# Patient Record
Sex: Female | Born: 1965 | Race: White | Hispanic: No | Marital: Married | Smoking: Former smoker
Health system: Southern US, Community
[De-identification: ages and names within clinical notes are randomized; demographics above are authoritative.]

## PROBLEM LIST (undated history)

## (undated) DIAGNOSIS — R112 Nausea with vomiting, unspecified: Secondary | ICD-10-CM

## (undated) DIAGNOSIS — E538 Deficiency of other specified B group vitamins: Secondary | ICD-10-CM

## (undated) DIAGNOSIS — Z9889 Other specified postprocedural states: Secondary | ICD-10-CM

## (undated) DIAGNOSIS — N83202 Unspecified ovarian cyst, left side: Secondary | ICD-10-CM

## (undated) DIAGNOSIS — K219 Gastro-esophageal reflux disease without esophagitis: Secondary | ICD-10-CM

## (undated) DIAGNOSIS — M549 Dorsalgia, unspecified: Secondary | ICD-10-CM

## (undated) DIAGNOSIS — G8929 Other chronic pain: Secondary | ICD-10-CM

## (undated) DIAGNOSIS — F32A Depression, unspecified: Secondary | ICD-10-CM

## (undated) DIAGNOSIS — M1990 Unspecified osteoarthritis, unspecified site: Secondary | ICD-10-CM

## (undated) DIAGNOSIS — F419 Anxiety disorder, unspecified: Secondary | ICD-10-CM

## (undated) DIAGNOSIS — F329 Major depressive disorder, single episode, unspecified: Secondary | ICD-10-CM

## (undated) DIAGNOSIS — R1031 Right lower quadrant pain: Secondary | ICD-10-CM

## (undated) DIAGNOSIS — N83201 Unspecified ovarian cyst, right side: Secondary | ICD-10-CM

## (undated) HISTORY — PX: BARTHOLIN GLAND CYST EXCISION: SHX565

## (undated) HISTORY — PX: CERVICAL FUSION: SHX112

## (undated) HISTORY — PX: LEFT OOPHORECTOMY: SHX1961

## (undated) HISTORY — PX: ABDOMINAL HYSTERECTOMY: SHX81

## (undated) HISTORY — PX: AUGMENTATION MAMMAPLASTY: SUR837

## (undated) HISTORY — PX: BACK SURGERY: SHX140

## (undated) HISTORY — PX: HERNIA REPAIR: SHX51

## (undated) HISTORY — PX: HIATAL HERNIA REPAIR: SHX195

---

## 1999-02-02 ENCOUNTER — Other Ambulatory Visit: Admission: RE | Admit: 1999-02-02 | Discharge: 1999-02-02 | Payer: Self-pay | Admitting: Obstetrics

## 1999-02-02 ENCOUNTER — Encounter: Admission: RE | Admit: 1999-02-02 | Discharge: 1999-02-02 | Payer: Self-pay | Admitting: Obstetrics & Gynecology

## 1999-02-19 ENCOUNTER — Encounter: Admission: RE | Admit: 1999-02-19 | Discharge: 1999-02-19 | Payer: Self-pay | Admitting: Obstetrics & Gynecology

## 1999-12-04 ENCOUNTER — Inpatient Hospital Stay (HOSPITAL_COMMUNITY): Admission: AD | Admit: 1999-12-04 | Discharge: 1999-12-04 | Payer: Self-pay | Admitting: Obstetrics

## 1999-12-07 ENCOUNTER — Emergency Department (HOSPITAL_COMMUNITY): Admission: EM | Admit: 1999-12-07 | Discharge: 1999-12-07 | Payer: Self-pay | Admitting: Emergency Medicine

## 2000-02-10 ENCOUNTER — Encounter: Admission: RE | Admit: 2000-02-10 | Discharge: 2000-02-10 | Payer: Self-pay | Admitting: Obstetrics

## 2000-12-31 ENCOUNTER — Emergency Department (HOSPITAL_COMMUNITY): Admission: EM | Admit: 2000-12-31 | Discharge: 2000-12-31 | Payer: Self-pay | Admitting: *Deleted

## 2001-05-08 ENCOUNTER — Other Ambulatory Visit: Admission: RE | Admit: 2001-05-08 | Discharge: 2001-05-08 | Payer: Self-pay | Admitting: *Deleted

## 2001-05-08 ENCOUNTER — Encounter (INDEPENDENT_AMBULATORY_CARE_PROVIDER_SITE_OTHER): Payer: Self-pay | Admitting: *Deleted

## 2001-05-08 ENCOUNTER — Encounter: Admission: RE | Admit: 2001-05-08 | Discharge: 2001-05-08 | Payer: Self-pay | Admitting: Obstetrics & Gynecology

## 2001-06-15 ENCOUNTER — Encounter: Payer: Self-pay | Admitting: Emergency Medicine

## 2001-06-15 ENCOUNTER — Emergency Department (HOSPITAL_COMMUNITY): Admission: EM | Admit: 2001-06-15 | Discharge: 2001-06-15 | Payer: Self-pay | Admitting: *Deleted

## 2002-08-26 ENCOUNTER — Other Ambulatory Visit: Admission: RE | Admit: 2002-08-26 | Discharge: 2002-08-26 | Payer: Self-pay | Admitting: Obstetrics and Gynecology

## 2003-02-13 ENCOUNTER — Other Ambulatory Visit: Admission: RE | Admit: 2003-02-13 | Discharge: 2003-02-13 | Payer: Self-pay | Admitting: Obstetrics and Gynecology

## 2003-08-28 ENCOUNTER — Other Ambulatory Visit: Admission: RE | Admit: 2003-08-28 | Discharge: 2003-08-28 | Payer: Self-pay | Admitting: Obstetrics and Gynecology

## 2004-11-01 ENCOUNTER — Other Ambulatory Visit: Admission: RE | Admit: 2004-11-01 | Discharge: 2004-11-01 | Payer: Self-pay | Admitting: Obstetrics and Gynecology

## 2005-04-25 ENCOUNTER — Other Ambulatory Visit: Admission: RE | Admit: 2005-04-25 | Discharge: 2005-04-25 | Payer: Self-pay | Admitting: Obstetrics and Gynecology

## 2005-05-21 ENCOUNTER — Inpatient Hospital Stay (HOSPITAL_COMMUNITY): Admission: AD | Admit: 2005-05-21 | Discharge: 2005-05-21 | Payer: Self-pay | Admitting: Obstetrics and Gynecology

## 2005-06-01 ENCOUNTER — Encounter: Admission: RE | Admit: 2005-06-01 | Discharge: 2005-06-01 | Payer: Self-pay | Admitting: Plastic Surgery

## 2005-08-18 ENCOUNTER — Encounter (INDEPENDENT_AMBULATORY_CARE_PROVIDER_SITE_OTHER): Payer: Self-pay | Admitting: Specialist

## 2005-08-18 ENCOUNTER — Observation Stay (HOSPITAL_COMMUNITY): Admission: RE | Admit: 2005-08-18 | Discharge: 2005-08-19 | Payer: Self-pay | Admitting: Obstetrics and Gynecology

## 2006-05-10 ENCOUNTER — Encounter: Admission: RE | Admit: 2006-05-10 | Discharge: 2006-05-10 | Payer: Self-pay | Admitting: Obstetrics and Gynecology

## 2006-05-14 ENCOUNTER — Inpatient Hospital Stay (HOSPITAL_COMMUNITY): Admission: AD | Admit: 2006-05-14 | Discharge: 2006-05-14 | Payer: Self-pay | Admitting: Obstetrics & Gynecology

## 2006-06-01 ENCOUNTER — Ambulatory Visit (HOSPITAL_COMMUNITY): Admission: RE | Admit: 2006-06-01 | Discharge: 2006-06-01 | Payer: Self-pay | Admitting: Obstetrics and Gynecology

## 2006-06-01 ENCOUNTER — Encounter (INDEPENDENT_AMBULATORY_CARE_PROVIDER_SITE_OTHER): Payer: Self-pay | Admitting: Specialist

## 2006-07-20 ENCOUNTER — Inpatient Hospital Stay (HOSPITAL_COMMUNITY): Admission: AD | Admit: 2006-07-20 | Discharge: 2006-07-21 | Payer: Self-pay | Admitting: Obstetrics and Gynecology

## 2006-08-18 ENCOUNTER — Encounter (INDEPENDENT_AMBULATORY_CARE_PROVIDER_SITE_OTHER): Payer: Self-pay | Admitting: Specialist

## 2006-08-18 ENCOUNTER — Ambulatory Visit (HOSPITAL_COMMUNITY): Admission: RE | Admit: 2006-08-18 | Discharge: 2006-08-18 | Payer: Self-pay | Admitting: Obstetrics and Gynecology

## 2006-10-08 ENCOUNTER — Emergency Department (HOSPITAL_COMMUNITY): Admission: EM | Admit: 2006-10-08 | Discharge: 2006-10-08 | Payer: Self-pay | Admitting: Emergency Medicine

## 2008-09-30 ENCOUNTER — Encounter: Admission: RE | Admit: 2008-09-30 | Discharge: 2008-09-30 | Payer: Self-pay | Admitting: Chiropractic Medicine

## 2008-10-10 ENCOUNTER — Ambulatory Visit (HOSPITAL_COMMUNITY): Admission: RE | Admit: 2008-10-10 | Discharge: 2008-10-10 | Payer: Self-pay | Admitting: Chiropractic Medicine

## 2009-06-03 ENCOUNTER — Emergency Department (HOSPITAL_COMMUNITY): Admission: EM | Admit: 2009-06-03 | Discharge: 2009-06-04 | Payer: Self-pay | Admitting: Emergency Medicine

## 2009-09-09 ENCOUNTER — Emergency Department: Payer: Self-pay | Admitting: Emergency Medicine

## 2010-01-20 ENCOUNTER — Encounter: Admission: RE | Admit: 2010-01-20 | Discharge: 2010-01-20 | Payer: Self-pay | Admitting: Neurosurgery

## 2010-02-12 ENCOUNTER — Ambulatory Visit (HOSPITAL_COMMUNITY): Admission: RE | Admit: 2010-02-12 | Discharge: 2010-02-12 | Payer: Self-pay | Admitting: Neurosurgery

## 2010-04-08 ENCOUNTER — Encounter: Admission: RE | Admit: 2010-04-08 | Discharge: 2010-04-08 | Payer: Self-pay | Admitting: Obstetrics and Gynecology

## 2010-04-15 ENCOUNTER — Encounter: Admission: RE | Admit: 2010-04-15 | Discharge: 2010-04-15 | Payer: Self-pay | Admitting: Neurosurgery

## 2010-08-29 ENCOUNTER — Encounter: Payer: Self-pay | Admitting: Neurosurgery

## 2010-10-24 LAB — DIFFERENTIAL
Basophils Relative: 0 % (ref 0–1)
Eosinophils Relative: 1 % (ref 0–5)
Lymphocytes Relative: 26 % (ref 12–46)
Monocytes Absolute: 0.4 10*3/uL (ref 0.1–1.0)
Monocytes Relative: 4 % (ref 3–12)
Neutro Abs: 5.6 10*3/uL (ref 1.7–7.7)

## 2010-10-24 LAB — CBC
HCT: 41.3 % (ref 36.0–46.0)
Hemoglobin: 14.3 g/dL (ref 12.0–15.0)
MCHC: 34.7 g/dL (ref 30.0–36.0)
MCV: 98 fL (ref 78.0–100.0)

## 2010-10-24 LAB — TYPE AND SCREEN
ABO/RH(D): A POS
Antibody Screen: NEGATIVE

## 2010-10-24 LAB — ABO/RH: ABO/RH(D): A POS

## 2010-11-11 LAB — CBC
HCT: 41.2 % (ref 36.0–46.0)
Hemoglobin: 14 g/dL (ref 12.0–15.0)
MCHC: 34 g/dL (ref 30.0–36.0)
MCV: 97.4 fL (ref 78.0–100.0)
RBC: 4.23 MIL/uL (ref 3.87–5.11)

## 2010-11-11 LAB — BASIC METABOLIC PANEL
CO2: 27 mEq/L (ref 19–32)
Chloride: 105 mEq/L (ref 96–112)
GFR calc Af Amer: >60 mL/min (ref >60)
Potassium: 4 mEq/L (ref 3.5–5.1)
Sodium: 137 mEq/L (ref 135–145)

## 2010-11-11 LAB — RAPID URINE DRUG SCREEN, HOSP PERFORMED
Barbiturates: NOT DETECTED
Opiates: NOT DETECTED

## 2010-11-11 LAB — DIFFERENTIAL
Basophils Relative: 0 % (ref 0–1)
Eosinophils Absolute: 0.1 10*3/uL (ref 0.0–0.7)
Eosinophils Relative: 1 % (ref 0–5)
Monocytes Absolute: 0.6 10*3/uL (ref 0.1–1.0)
Monocytes Relative: 7 % (ref 3–12)
Neutro Abs: 4.8 10*3/uL (ref 1.7–7.7)

## 2010-12-24 NOTE — Op Note (Signed)
NAMEKANIA, REGNIER                 ACCOUNT NO.:  [id number]   MEDICAL RECORD NO.:  [id number]          PATIENT TYPE:  AMB   LOCATION:  SDC                           [facility]ILITY:  [facility]   PHYSICIAN:  Duke Salvia. Marcelle Overlie, M.D.DATE OF BIRTH:  12/08/1965   DATE OF PROCEDURE:  06/01/2006  DATE OF DISCHARGE:                                 OPERATIVE REPORT   PREOPERATIVE DIAGNOSIS:  Inflamed left labia minora sebaceous cyst.   POSTOPERATIVE DIAGNOSIS:  Inflamed left labia minora sebaceous cyst.   PROCEDURE:  Excision of labial cyst.   ANESTHESIA:  Local plus general.   BLOOD LOSS:  Minimal.   SPECIMEN REMOVED:  Left labial cyst.   SURGEON:  Duke Salvia. Marcelle Overlie, M.D.   PROCEDURE AND FINDINGS:  The patient taken to the operating room.  After  adequate level of general anesthesia was obtained, the patient legs in  stirrups, perineum and vagina prepped and draped with Betadine.  The left  labia minora cyst was infiltrated with 0.5% Marcaine plain, was grasped with  an Allis clamp, scalpel used to excise an ellipse, a Bovie used for  hemostasis at the base, then closure obtained with interrupted 3-0 Vicryl  Rapide sutures with excellent hemostasis.  She received Toradol IV in the  operating room and went to recovery room in good condition.      Richard M. Marcelle Overlie, M.D.  Electronically Signed     RMH/MEDQ  D:  06/01/2006  T:  06/02/2006  Job:  [id number]

## 2010-12-24 NOTE — H&P (Signed)
NAMECAROLAN, [name]                 ACCOUNT NO.:  [id number]   MEDICAL RECORD NO.:  [id number]          PATIENT TYPE:  AMB   LOCATION:  SDC                           [facility]   PHYSICIAN:  Duke Salvia. Marcelle Overlie, M.D.DATE OF BIRTH:  1966-04-11   DATE OF ADMISSION:  DATE OF DISCHARGE:                              HISTORY & PHYSICAL   CHIEF COMPLAINT:  Recurrent left ovarian cyst, chronic pelvic pain.   HISTORY OF PRESENT ILLNESS:  A 45 year old G2, P2, prior tubal.  This  patient underwent 1 year ago LAVH.  At that time her ovaries were  unremarkable and both were conserved.  That was performed secondary to  pelvic pain and possible adenomyosis.  The pathology report from her  hysterectomy showed no endometriosis or other abnormalities with an  unremarkable cervix.  She has done reasonably well since her  hysterectomy but has developed recurrent problems with pelvic and left  lower quadrant pain and we have been following a complex left ovarian  cyst.  Also this year she has had excision of a chronic left labial  cyst.  Most recent ultrasound at [facility] showed a complex  cyst in the left ovary, may represent a hemorrhagic follicle that was  approximately 2.3 x 2.3 x 2.9 cm with a moderate amount of fluid noted.  Due to the chronicity of this problem, she would prefer to have  laparoscopy with LSO.  This procedure including risks of bleeding,  infection, adjacent organ injury, the possible need for open or  additional surgery all discussed with her, which she understands and  accepts.   PAST MEDICAL HISTORY:  Prior surgery:  She has had abdominoplasty,  breast augmentation, LEEP, tubal, LAVH.   OBSTETRICAL HISTORY:  Two vaginal deliveries at term.   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   FAMILY HISTORY:  Significant for a sister with asthma.  Mother and  father both with hypertension and her mother is an  insulin-dependent  diabetic.   PHYSICAL EXAMINATION:  VITAL SIGNS:  Temperature 98.2, blood pressure  120/78.  HEENT:  Unremarkable.  NECK:  Supple without masses.  LUNGS:  Clear.  CARDIOVASCULAR:  Regular rate and rhythm without murmurs, rubs, or  gallops noted.  BREASTS:  Without masses.  She has had breast augmentation.  ABDOMEN:  Soft, flat, nontender.  Well-healed abdominoplasty.  PELVIC:  Vulva, vagina, vaginal cuff unremarkable.  Bimanual revealed  some tenderness in the left lower quadrant.  No definite mass noted.  EXTREMITIES AND NEUROLOGIC:  Unremarkable.   IMPRESSION:  Chronic left lower quadrant pain, recurrence of left  ovarian cyst.   PLAN:  Diagnostic laparoscopy with LSO.  Procedure and risks reviewed as  above.      Richard M. Marcelle Overlie, M.D.  Electronically Signed     RMH/MEDQ  D:  08/15/2006  T:  08/15/2006  Job:  [id number]

## 2010-12-24 NOTE — Op Note (Signed)
[name], [name]                 ACCOUNT NO.:  [id number]   MEDICAL RECORD NO.:  [id number]          PATIENT TYPE:  OIB   LOCATION:  9308                          [facility]ILITY:  [facility]   PHYSICIAN:  Duke Salvia. Marcelle Overlie, M.D.DATE OF BIRTH:  1965-10-13   DATE OF PROCEDURE:  08/28/2006  DATE OF DISCHARGE:  08/18/2006                               OPERATIVE REPORT   PREOPERATIVE DIAGNOSIS:  Acute and chronic pelvic pain, mainly left  lower quadrant and recurrent ovarian cyst.   POSTOPERATIVE DIAGNOSIS:  Acute and chronic pelvic pain, mainly left  lower quadrant and recurrent ovarian cyst, plus torsion of the left  tube.   PROCEDURE:  Diagnostic laparoscopy with lysis of adhesions and left  salpingo-oophorectomy.   SPECIMENS REMOVED:  Left tube and ovary.   SURGEON:  Duke Salvia. Marcelle Overlie, M.D.   ANESTHESIA:  General endotracheal.   COMPLICATIONS:  None.   DRAINS:  Foley catheter.   BLOOD LOSS:  Minimal.   PROCEDURE AND FINDINGS:  The patient taken to the operating room.  After  an adequate level of general anesthesia was attained, the patient's legs  in stirrups, the abdomen, perineum and vagina prepped and draped in  usual manner for laparoscopy.  The bladder was drained.  Attention  directed to the abdomen and the subumbilical area was infiltrated with  0.5% Marcaine plain.  A small incision was made and the Veress needle  was introduced without difficulty.  Its intra-abdominal position was  verified by pressure and water testing.  A 2.5 liter pneumoperitoneum  was then created.  Laparoscopic trocar and sleeve were then introduced  without difficulty.  Three fingerbreadths above the symphysis in the  midline a 5 mm trocar was inserted under direct visualization.  The  patient then placed in Trendelenburg.  The right ovary was completely  normal.  The uterus was surgically absent.  The omentum was adherent  into the area of the left ovary with some filmy adhesions.  Also there  was  distal torsion of the left fallopian tube which was kinked about  midsegment although the distal end was swollen and did still appeared  pink.  Left ovary was adherent to left pelvic side wall with filmy  adhesions.  The appendix and upper abdomen were otherwise unremarkable.   A third 5 mm trocar was inserted in the left lower quadrant after  negative transillumination.  With sharp scissors the adhesions were  lysed and the ovary was elevated off of the pelvic sidewall.  The course  of the ureter was noted to be below using the gyrus P K instrument,  the  left tube and ovary were coagulated and divided, cut into approximately  four pieces and removed through the scope.  The pelvis was then  irrigated with saline and aspirated.  The operative  site was noted to be hemostatic.  All ovarian fragments had been  removed.  Prior to closure sponge, needle and instrument counts were  reported as correct x2.  Gas allowed to escape.  Defect was closed with  4-0 Dexon subcuticular sutures and Dermabond.  She tolerated  this well  and went to recovery room in good condition.      Richard M. Marcelle Overlie, M.D.  Electronically Signed     RMH/MEDQ  D:  08/28/2006  T:  08/28/2006  Job:  [id number]

## 2010-12-24 NOTE — Op Note (Signed)
NAMEERIYAH, FERNANDO                 ACCOUNT NO.:  [id number]   MEDICAL RECORD NO.:  [id number]          PATIENT TYPE:  OBV   LOCATION:  9399                          [facility]  [facility]   PHYSICIAN:  Duke Salvia. Marcelle Overlie, M.D.DATE OF BIRTH:  10-16-1965   DATE OF PROCEDURE:  08/18/2005  DATE OF DISCHARGE:                                 OPERATIVE REPORT   PREOPERATIVE DIAGNOSES:  1.  History of persistent abnormal Papanicolaou smear.  2.  Chronic pelvic pain.   POSTOPERATIVE DIAGNOSIS:  Possible adenomyosis.   PROCEDURE:  Laparoscopically-assisted vaginal hysterectomy, lysis of  adhesions.   SURGEON:  Duke Salvia. Marcelle Overlie, M.D.   ASSISTANT:  Zelphia Cairo, M.D.   SPECIMENS REMOVED:  Uterus.   ESTIMATED BLOOD LOSS:  300 mL.   SPECIMENS REMOVED:  Uterus.   PROCEDURE AND FINDINGS:  The patient was taken to the operating room.  After  an adequate level of general endotracheal anesthesia was obtained with the  patient's legs in stirrups, the abdomen, perineum and vagina were prepped  and draped in the usual manner for a laparoscopy and the bladder was  drained.  EUA showed uterus to be the midposition, upper limit of normal  size.  Adnexa negative.  A Hulka tenaculum was positioned.  The subumbilical  area was infiltrated with 0.5% Marcaine plain.  A small incision was made  for an open technique.  The fascia and peritoneum were entered without  difficulty individually.  The open trocar sleeve was then positioned and  pneumoperitoneum was then created.  After a 2.5 L pneumoperitoneum was  observed, the laparoscope was inserted.  A 5 mm trocar was placed three  fingerbreadths above the symphysis in the midline under direct  visualization.  The video observation then showed that the anterior and  posterior cul-de-sac were unremarkable.  There was a solitary omental  adhesion to the right ovary which was lysed in an avascular plane.  The  right tube and ovary were normal.  The left ovary  had a corpus luteum cyst  of one pole but was otherwise unremarkable.  The upper abdomen was normal.  The Gyrus PK instrument was then positioned and the utero-ovarian pedicle  was coagulated and cut down to and including the round ligament.  The exact  same repeated on the opposite side with excellent hemostasis.  The vaginal  portion procedure was started at that point.  The cervix was grasped with a  tenaculum after a weighted speculum was positioned.  The cervicovaginal  mucosa was incised, posterior culdotomy performed without difficulty and the  bladder advanced superiorly.  The Gyrus PK instrument handheld was then used  to clamp, coagulate and cut the uterosacral ligaments.  The anterior  peritoneum could be identified, was entered, and a retractor used to gently  elevate the bladder out of the field.  In a sequential manner staying close  to the uterus, the cardinal ligament, uterine vasculature pedicle and upper  broad ligament pedicles were clamped, coagulated and cut.  The fundus of the  uterus was then delivered posteriorly.  The remaining pedicles were clamped,  coagulated and divided.  The cuff was then closed from 3 to 9 o'clock with a  running 2-0 Dexon locked suture.  A McCall culdoplasty suture was then  placed from left uterosacral ligament picking up posterior peritoneum across  to the opposite uterosacral ligament, which was then tied down.  Prior to  closure, sponge, needle and instrument counts were reported as correct x2.  The vaginal mucosa was then closed right-to-left with interrupted 2-0  Monocryl sutures.  Foley catheter positioned draining clear urine.   Repeat laparoscopy with Nezhat irrigation revealed excellent hemostasis of  the operative site even at reduced pressure.  Prior to closure, the sponge,  needle and instrument counts were reported as correct x2, instruments were  removed and the lower incision was glued with Dermabond.  The fascia on the   subumbilical incision was closed with a 2-0 Vicryl suture.  Vicryl 4-0  subcuticular on the skin with a pressure dressing applied.  She tolerated  this well, went to the recovery room in good condition.      Richard M. Marcelle Overlie, M.D.  Electronically Signed     RMH/MEDQ  D:  08/18/2005  T:  08/18/2005  Job:  [id number]

## 2010-12-24 NOTE — Discharge Summary (Signed)
NAMEBRYSTOL, [name]                 ACCOUNT NO.:  [id number]   MEDICAL RECORD NO.:  [id number]          PATIENT TYPE:  OBV   LOCATION:  9310                          [facility]   PHYSICIAN:  Duke Salvia. Marcelle Overlie, M.D.DATE OF BIRTH:  05/20/1966   DATE OF ADMISSION:  08/18/2005  DATE OF DISCHARGE:  08/19/2005                                 DISCHARGE SUMMARY   DISCHARGE DIAGNOSES:  1.  History of recurrent abnormal Pap smear.  2.  Pelvic pain.  3.  Possible adenomyosis.  4.  Laparoscopically assisted vaginal hysterectomy this admission.   HISTORY OF PRESENT ILLNESS:  For summary of admission history and physical  examination, please see the admission history and physical for details.  Briefly, a 45 year old G2, P2 who has had a prior tubal, LEEP in 1995 with  recurrent borderline Pap and chronic pelvic pain presents for definitive  hysterectomy.   HOSPITAL COURSE:  On August 18, 2005 under general anesthesia the patient  underwent laparoscopically assisted vaginal hysterectomy without  complications.  Small functional cyst was noted on the left but both ovaries  were conserved.  On the first postoperative day she was ready for discharge  home on regular diet, was afebrile, ambulatory and voiding without  difficulty.  Preoperative hemoglobin was 13.2, postoperative was on August 19, 2005 was 10.2.  The remainder of her blood work A positive blood type  antibody screen negative, UPT was negative.   DISPOSITION:  Patient is discharged on Tylox PRN pain, Phenergan tablets 25  mg one p.o. q.4-6h. PRN nausea.  She will return to the office in 7 to 10  days. She is advised to report any increased pain or bleeding, fever over  101.  She was given specific instructions regarding diet, sex and exercise.  Also advised to take stool softeners once or twice daily along with fiber  supplements or laxatives as needed.   CONDITION ON DISCHARGE:  Good.   ACTIVITY:  Graded increase.      Richard M. Marcelle Overlie, M.D.  Electronically Signed     RMH/MEDQ  D:  08/19/2005  T:  08/19/2005  Job:  [id number]

## 2010-12-24 NOTE — H&P (Signed)
NAMESAVONNA, [name]                 ACCOUNT NO.:  [id number]   MEDICAL RECORD NO.:  [id number]          PATIENT TYPE:  INP   LOCATION:                                [facility]   PHYSICIAN:  Duke Salvia. Marcelle Overlie, M.D.DATE OF BIRTH:  [birth year]   DATE OF ADMISSION:  08/18/2005  DATE OF DISCHARGE:                                HISTORY & PHYSICAL   CHIEF COMPLAINT:  Recurrent abnormal Pap smear, right ovarian cysts.   HISTORY OF PRESENT ILLNESS:  45 year old G2, P2, prior tubal.  This patient  had LEEP in 1995.  Since that time has had recurrent ASCUS or borderline  Paps.  The last including biopsy October 2006 showed no dysplasia, chronic  endocervicitis with Pap that was read out prior to that as ASCUS, high-risk  HPV not detected.   Due to some problems related to pelvic pain Dr. Odis [name] who had performed her  prior abdominoplasty ordered a CT with contrast June 01, 2005 that showed  a 2.4 cm left ovarian cyst with a dominant follicle on the right, no other  abnormalities.  This patient has had several concerns, not only the  recurrent ASCUS Paps, although the evaluation has been negative, but also  some cramping and chronic pelvic pain.  She has decided that she wants to  proceed with definitive hysterectomy.  This procedure of LAVH, possible USO  discussed in detail, the risks relative to bleeding, infection, adjacent  organ injury, the possible need for open or additional surgery, USO or BSO  along with the possible need for ERT all reviewed with her which she  understands and accepts.   PAST MEDICAL HISTORY:   ALLERGIES:  None.   OPERATIONS:  1.  Abdominoplasty.  2.  Tubal ligation.  3.  LEEP.   OBSTETRICAL HISTORY:  Two vaginal deliveries at term.   REVIEW OF SYSTEMS:  Significant for smoking one PPD and a history of HSV in  1989 without recurrence.   FAMILY HISTORY:  Significant for a sister with asthma.  Father and mother  both hypertension and her mother is  insulin-dependent diabetic.   PHYSICAL EXAMINATION:  VITAL SIGNS:  Temperature 98.2, blood pressure  120/70.  HEENT:  Unremarkable.  NECK:  Supple without masses.  LUNGS:  Clear.  CARDIOVASCULAR:  Regular rate and rhythm without murmurs, rubs, or gallops  noted.  BREASTS:  Implants.  ABDOMEN:  Soft, flat, nontender.  Her abdominoplasty incisions were well  healed.  Vulva, vagina, cervix were normal.  Uterus:  Mid position, normal  size, mobile.  Adnexa negative.  EXTREMITIES:  Unremarkable.  NEUROLOGIC:  Unremarkable.   IMPRESSION:  1.  Recurrent ASCUS Pap, history of prior LEEP.  2.  Pelvic pain.  3.  Ovarian cysts.   PLAN:  LAVH, possible USO.  Procedure and risks reviewed as above.      Richard M. Marcelle Overlie, M.D.  Electronically Signed     RMH/MEDQ  D:  08/15/2005  T:  08/15/2005  Job:  [id number]

## 2011-02-23 ENCOUNTER — Other Ambulatory Visit: Payer: Self-pay | Admitting: Family Medicine

## 2011-02-23 DIAGNOSIS — M545 Low back pain: Secondary | ICD-10-CM

## 2011-02-23 DIAGNOSIS — M542 Cervicalgia: Secondary | ICD-10-CM

## 2011-03-01 ENCOUNTER — Ambulatory Visit
Admission: RE | Admit: 2011-03-01 | Discharge: 2011-03-01 | Disposition: A | Discharge status: Home / Self Care | Payer: Managed Care, Other (non HMO) | Source: Ambulatory Visit | Attending: Family Medicine | Admitting: Family Medicine

## 2011-03-01 DIAGNOSIS — M545 Low back pain: Secondary | ICD-10-CM

## 2011-03-01 DIAGNOSIS — M542 Cervicalgia: Secondary | ICD-10-CM

## 2011-04-15 ENCOUNTER — Encounter (HOSPITAL_COMMUNITY): Payer: Self-pay | Admitting: *Deleted

## 2011-04-15 ENCOUNTER — Inpatient Hospital Stay (HOSPITAL_COMMUNITY)
Admission: AD | Admit: 2011-04-15 | Discharge: 2011-04-16 | Disposition: A | Discharge status: Home / Self Care | Payer: Managed Care, Other (non HMO) | Source: Ambulatory Visit | Attending: Obstetrics and Gynecology | Admitting: Obstetrics and Gynecology

## 2011-04-15 DIAGNOSIS — R1031 Right lower quadrant pain: Secondary | ICD-10-CM | POA: Insufficient documentation

## 2011-04-15 HISTORY — DX: Unspecified ovarian cyst, left side: N83.202

## 2011-04-15 HISTORY — DX: Unspecified ovarian cyst, right side: N83.201

## 2011-04-15 HISTORY — DX: Other chronic pain: G89.29

## 2011-04-15 HISTORY — DX: Dorsalgia, unspecified: M54.9

## 2011-04-15 NOTE — Progress Notes (Signed)
Pt did not feel well Thurs night and have fever. Forehead felt hot and had chills but did not take temp. Felt ok this am but about 1300 started having pain upper and lower abd. Constant dull pain with occ sharpness. Hx ovarian cysts L ovary removed.

## 2011-04-16 ENCOUNTER — Inpatient Hospital Stay (HOSPITAL_COMMUNITY): Payer: Managed Care, Other (non HMO)

## 2011-04-16 LAB — CBC
HCT: 40.2 % (ref 36.0–46.0)
Hemoglobin: 13.5 g/dL (ref 12.0–15.0)
MCHC: 33.6 g/dL (ref 30.0–36.0)
WBC: 7.8 10*3/uL (ref 4.0–10.5)

## 2011-04-16 LAB — URINALYSIS, ROUTINE W REFLEX MICROSCOPIC
Bilirubin Urine: NEGATIVE
Glucose, UA: NEGATIVE mg/dL
Specific Gravity, Urine: >1.03 — ABNORMAL HIGH (ref 1.005–1.030)
pH: 5.5 (ref 5.0–8.0)

## 2011-04-16 LAB — URINE MICROSCOPIC-ADD ON

## 2011-04-16 MED ORDER — KETOROLAC TROMETHAMINE 60 MG/2ML IM SOLN
60.0000 mg | Freq: Once | INTRAMUSCULAR | Status: AC
  Administered 2011-04-16: 60 mg via INTRAMUSCULAR
  Filled 2011-04-16: qty 2

## 2011-04-16 MED ORDER — OXYCODONE-ACETAMINOPHEN 5-325 MG PO TABS
2.0000 | ORAL_TABLET | Freq: Once | ORAL | Status: AC
  Administered 2011-04-16: 2 via ORAL
  Filled 2011-04-16: qty 2

## 2011-04-16 NOTE — ED Provider Notes (Signed)
Chief Complaint:  Abdominal Pain   [name] is  45 y.o. G2P0.  No LMP recorded. Patient has had a hysterectomy..  She presents complaining of Abdominal Pain . Onset is described as insidious and has been present for  Several  Hours. RLQ pain started this afternoon, worsened after going to bed. Describes pain as constant dull cramp with intermittent sharp shooting pain that radiates to RUQ and left side. Denies fever, chills, nausea, or vomiting.  Obstetrical/Gynecological History: OB History    Grav Para Term Preterm Abortions TAB SAB Ect Mult Living   2         2      Past Medical History: Past Medical History  Diagnosis Date  . Cysts of both ovaries   . Chronic back pain     had n/v and  fainting after procedure. N/v continues    Past Surgical History: Past Surgical History  Procedure Date  . Left oophorectomy     intestine twisted around tube and ovary   . Abdominal hysterectomy   . Bartholin gland cyst excision   . Cervical fusion     plates and screws in neck    Family History: No family history on file.  Social History: History  Substance Use Topics  . Smoking status: Current Everyday Smoker  . Smokeless tobacco: Never Used  . Alcohol Use: No    Allergies: No Known Allergies  Prescriptions prior to admission  Medication Sig Dispense Refill  . ALPRAZolam (XANAX) 1 MG tablet Take 1 mg by mouth 4 (four) times daily.        . cyclobenzaprine (FLEXERIL) 10 MG tablet Take 10 mg by mouth at bedtime as needed. For muscle spasm       . docusate sodium (COLACE) 100 MG capsule Take 100 mg by mouth 2 (two) times daily as needed. For constipation       . metoCLOPramide (REGLAN) 10 MG tablet Take 5-10 mg by mouth 2 (two) times daily as needed. For nausea       . oxyCODONE (ROXICODONE) 15 MG immediate release tablet Take 15 mg by mouth every 6 (six) hours as needed. For pain       . vitamin B-12 (CYANOCOBALAMIN) 1000 MCG tablet Take 2,000 mcg by mouth daily.           Review of Systems - Negative except what has been reviewed in HPI  Physical Exam   Blood pressure 135/95, pulse 96, temperature 98.5 F (36.9 C), resp. rate 22, height 5\' 3"  (1.6 m), weight 64.32 kg (141 lb 12.8 oz).  General: General appearance - alert, well appearing, and in no distress, normal appearing weight and uncomfortable Mental status - alert, oriented to person, place, and time, normal mood, behavior, speech, dress, motor activity, and thought processes, affect appropriate to mood Abdomen - tenderness noted diffuse, no guarding, no rebound, no pain with passive movement, neg psoas Focused Gynecological Exam: no palpable internal organs, right ovary/adnexa non palp, nontender to exam  Labs: Recent Results (from the past 24 hour(s))  URINALYSIS, ROUTINE W REFLEX MICROSCOPIC   Collection Time   04/16/11 12:20 AM      Component Value Range   Color, Urine YELLOW  YELLOW    Appearance CLEAR  CLEAR    Specific Gravity, Urine >1.030 (*) 1.005 - 1.030    pH 5.5  5.0 - 8.0    Glucose, UA NEGATIVE  NEGATIVE (mg/dL)   Hgb urine dipstick MODERATE (*) NEGATIVE  Bilirubin Urine NEGATIVE  NEGATIVE    Ketones, ur NEGATIVE  NEGATIVE (mg/dL)   Protein, ur NEGATIVE  NEGATIVE (mg/dL)   Urobilinogen, UA 0.2  0.0 - 1.0 (mg/dL)   Nitrite NEGATIVE  NEGATIVE    Leukocytes, UA NEGATIVE  NEGATIVE   URINE MICROSCOPIC-ADD ON   Collection Time   04/16/11 12:20 AM      Component Value Range   Squamous Epithelial / LPF FEW (*) RARE    WBC, UA 3-6  <3 (WBC/hpf)   RBC / HPF 3-6  <3 (RBC/hpf)   Bacteria, UA FEW (*) RARE   CBC   Collection Time   04/16/11  1:01 AM      Component Value Range   WBC 7.8  4.0 - 10.5 (K/uL)   RBC 4.17  3.87 - 5.11 (MIL/uL)   Hemoglobin 13.5  12.0 - 15.0 (g/dL)   HCT 16.1  09.6 - 04.5 (%)   MCV 96.4  78.0 - 100.0 (fL)   MCH 32.4  26.0 - 34.0 (pg)   MCHC 33.6  30.0 - 36.0 (g/dL)   RDW 40.9  81.1 - 91.4 (%)   Platelets 157  150 - 400 (K/uL)   Imaging Studies:   *RADIOLOGY REPORT*  Clinical Data: Right lower quadrant abdominal pain.  TRANSABDOMINAL AND TRANSVAGINAL ULTRASOUND OF PELVIS  Technique: Both transabdominal and transvaginal ultrasound  examinations of the pelvis were performed. Transabdominal technique  was performed for global imaging of the pelvis including uterus,  ovaries, adnexal regions, and pelvic cul-de-sac.  Comparison: Pelvic ultrasound performed 07/21/2006  It was necessary to proceed with endovaginal exam following the  transabdominal exam to visualize the right ovary.  Findings:  Uterus: Status post hysterectomy;no focal abnormalities seen.  Endometrium: N/A  Right ovary: Normal appearance/no adnexal mass; measures 3.4 x 2.0  x 2.1 cm. Color Doppler evaluation demonstrates grossly normal  color Doppler blood flow with respect to the right ovary.  Left ovary: Status post left-sided oophorectomy, per clinical  correlation.  Other findings: No free fluid seen within the pelvis.  IMPRESSION:  Status post hysterectomy and left-sided oophorectomy; right ovary  is unremarkable in appearance.  Original Report Authenticated By: Tonia Ghent, M.D.    Assessment: Abdominal Pain  Plan: Discharge Home Rx Percocet Follow up next with with Dr. Misty Stanley E. 04/16/2011,3:01 AM

## 2011-04-16 NOTE — Progress Notes (Signed)
Pt back from [facility]

## 2011-04-16 NOTE — ED Notes (Signed)
Pt up to [facility] to obtain u/a. States movement makes pain much worse. Reports normal BMS. Had epidural inj in back about 3 wks ago and has n/v since then. Takes Reglan which helps.

## 2011-04-16 NOTE — Progress Notes (Signed)
Written and verbal d/c instructions given and understanding voiced.  0310 S. [facility] CNM in to discuss u/s results and f/u with Dr Marcelle Overlie next wk.

## 2011-04-16 NOTE — Progress Notes (Signed)
[facility] CN[facility] in to see pt. Bimanual exam done and pt tol well.

## 2011-04-16 NOTE — Progress Notes (Signed)
Pt aware is for u/s and is 3rd in line. Heat Packs to pt's abdomen per request. Oked by S. [employer]

## 2011-04-16 NOTE — ED Notes (Signed)
0220 Pt to u/s via w/c

## 2013-04-13 ENCOUNTER — Emergency Department (HOSPITAL_COMMUNITY)
Admission: EM | Admit: 2013-04-13 | Discharge: 2013-04-13 | Disposition: A | Discharge status: Home / Self Care | Payer: Self-pay | Source: Home / Self Care

## 2013-04-13 ENCOUNTER — Encounter (HOSPITAL_COMMUNITY): Payer: Self-pay | Admitting: Emergency Medicine

## 2013-04-13 DIAGNOSIS — S39012S Strain of muscle, fascia and tendon of lower back, sequela: Secondary | ICD-10-CM

## 2013-04-13 DIAGNOSIS — IMO0002 Reserved for concepts with insufficient information to code with codable children: Secondary | ICD-10-CM

## 2013-04-13 LAB — POCT URINALYSIS DIP (DEVICE)
Glucose, UA: NEGATIVE mg/dL
Nitrite: NEGATIVE
Protein, ur: NEGATIVE mg/dL
Specific Gravity, Urine: <=1.005 (ref 1.005–1.030)
Urobilinogen, UA: 0.2 mg/dL (ref 0.0–1.0)

## 2013-04-13 LAB — POCT PREGNANCY, URINE: Preg Test, Ur: NEGATIVE

## 2013-04-13 MED ORDER — KETOROLAC TROMETHAMINE 30 MG/ML IJ SOLN
30.0000 mg | Freq: Once | INTRAMUSCULAR | Status: AC
  Administered 2013-04-13: 30 mg via INTRAMUSCULAR

## 2013-04-13 MED ORDER — DICLOFENAC POTASSIUM 50 MG PO TABS: 50.0000 mg | ORAL_TABLET | Freq: Three times a day (TID) | ORAL | Status: DC

## 2013-04-13 MED ORDER — HYDROCODONE-ACETAMINOPHEN 5-325 MG PO TABS: 1.0000 | ORAL_TABLET | ORAL | Status: DC | PRN

## 2013-04-13 MED ORDER — KETOROLAC TROMETHAMINE 30 MG/ML IJ SOLN
INTRAMUSCULAR | Status: AC
  Filled 2013-04-13: qty 1
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
## 2013-04-13 NOTE — ED Provider Notes (Signed)
CSN: [id number]     Arrival date & time 04/13/13  1639 History   First MD Initiated Contact with Patient 04/13/13 1700     Chief Complaint  Patient presents with  . Back Pain   (Consider location/radiation/quality/duration/timing/severity/associated sxs/prior Treatment) HPI Comments: 47 year old female states that she was lifting boxes yesterday he turned quickly and injured her right lower back. She is complaining of pain in the left paralumbar musculature that is worse with turning her body, attending to bend over, getting up from a chair in attempting to get into a chair. Denies focal paresthesias or weakness. She has a history of chronic intermittent low back pain and a history of herniated discs. She states she found her an old Vicodin of her husbands and some Ultram she is taking each of those and they did not help.   Past Medical History  Diagnosis Date  . Cysts of both ovaries   . Chronic back pain     had n/v and  fainting after procedure. N/v continues   Past Surgical History  Procedure Laterality Date  . Left oophorectomy      intestine twisted around tube and ovary   . Abdominal hysterectomy    . Bartholin gland cyst excision    . Cervical fusion      plates and screws in neck   No family history on file. History  Substance Use Topics  . Smoking status: Current Every Day Smoker  . Smokeless tobacco: Never Used  . Alcohol Use: No   OB History   Grav Para Term Preterm Abortions TAB SAB Ect Mult Living   2         2     Review of Systems  Constitutional: Positive for activity change. Negative for fever, chills and fatigue.  HENT: Negative.   Respiratory: Negative.   Cardiovascular: Negative.   Musculoskeletal:       As per HPI  Skin: Negative for color change, pallor and rash.  Neurological: Negative.     Allergies  Review of patient's allergies indicates no known allergies.  Home Medications   Current Outpatient Rx  Name  Route  Sig  Dispense  Refill   . ALPRAZolam (XANAX) 1 MG tablet   Oral   Take 1 mg by mouth 4 (four) times daily.           . cyclobenzaprine (FLEXERIL) 10 MG tablet   Oral   Take 10 mg by mouth at bedtime as needed. For muscle spasm          . diclofenac (CATAFLAM) 50 MG tablet   Oral   Take 1 tablet (50 mg total) by mouth 3 (three) times daily.   21 tablet   0   . docusate sodium (COLACE) 100 MG capsule   Oral   Take 100 mg by mouth 2 (two) times daily as needed. For constipation          . HYDROcodone-acetaminophen (NORCO/VICODIN) 5-325 MG per tablet   Oral   Take 1 tablet by mouth every 4 (four) hours as needed for pain.   15 tablet   0   . metoCLOPramide (REGLAN) 10 MG tablet   Oral   Take 5-10 mg by mouth 2 (two) times daily as needed. For nausea          . vitamin B-12 (CYANOCOBALAMIN) 1000 MCG tablet   Oral   Take 2,000 mcg by mouth daily.  BP 91/65  Pulse 104  Temp(Src) 97.6 F (36.4 C) (Oral)  Resp 16  SpO2 98% Physical Exam  Nursing note and vitals reviewed. Constitutional: She is oriented to person, place, and time. She appears well-developed and well-nourished. No distress.  HENT:  Head: Normocephalic and atraumatic.  Eyes: EOM are normal. Pupils are equal, round, and reactive to light.  Neck: Normal range of motion. Neck supple.  Musculoskeletal:  Tenderness to the left paralumbar musculature. No spinal tenderness. Has rather exaggerated response to light touch. She rising from a chair slowly needs into the chair slowly. Her lower extremities strength is 5 over 5. He is able to walk with the short stride the evening balance.  Lymphadenopathy:    She has no cervical adenopathy.  Neurological: She is alert and oriented to person, place, and time. No cranial nerve deficit.  Skin: Skin is warm and dry.  Psychiatric: She has a normal mood and affect.    ED Course  Procedures (including critical care time) Labs Review Labs Reviewed  POCT PREGNANCY, URINE    Imaging Review No results found. Results for orders placed during the hospital encounter of 04/13/13  POCT PREGNANCY, URINE      Result Value Range   Preg Test, Ur NEGATIVE  NEGATIVE    MDM   1. Lumbar strain, sequela      Heat Limit bending, stooping, no lifting. Toradol 30mg  IM Norco 5 q 4h prn pain #15 F/U with your PCP that you named as needed.   Hayden Rasmussen, NP 04/13/13 [phone]

## 2013-04-14 NOTE — ED Provider Notes (Signed)
Medical screening examination/treatment/procedure(s) were performed by resident physician or non-physician practitioner and as supervising physician I was immediately available for consultation/collaboration.   Danijah Noh DOUGLAS MD.   Johany Hansman D Keldan Eplin, MD 04/14/13 1558 

## 2013-05-17 ENCOUNTER — Ambulatory Visit (INDEPENDENT_AMBULATORY_CARE_PROVIDER_SITE_OTHER): Payer: Self-pay | Admitting: Surgery

## 2013-05-17 ENCOUNTER — Encounter (INDEPENDENT_AMBULATORY_CARE_PROVIDER_SITE_OTHER): Payer: Self-pay

## 2013-05-17 ENCOUNTER — Encounter (INDEPENDENT_AMBULATORY_CARE_PROVIDER_SITE_OTHER): Payer: Self-pay | Admitting: Surgery

## 2013-05-17 ENCOUNTER — Other Ambulatory Visit (INDEPENDENT_AMBULATORY_CARE_PROVIDER_SITE_OTHER): Payer: Self-pay

## 2013-05-17 VITALS — BP 122/74 | HR 96 | Temp 98.1°F | Resp 14 | Ht 62.0 in | Wt 174.2 lb

## 2013-05-17 DIAGNOSIS — E669 Obesity, unspecified: Secondary | ICD-10-CM

## 2013-05-17 NOTE — Progress Notes (Signed)
Chief Complaint:  Patient desires lap band.  History of Present Illness:  [name] is an 46 y.o. female who has had some self-image issues since she was in her late 44s. She struggled with obesity and also some anxiety issues. She has had some significant issues with her back and has had a cervical fusion he now has some lumbar issues for which she sees [employer].  She was at our seminar and is interested in lap band. A lap band that was provided to her. She is a BMI of 30 one would be a low BMI patient and is planning to pay out of pocket.   as a preliminary step I want her to see Maralyn Sago Himmelrich to make certain she is understanding the dietary changes that would be involved. I also would like for her to see a psychologist for psychological evaluation. I'll see her after these 2 were done.  Past Medical History  Diagnosis Date  . Cysts of both ovaries   . Chronic back pain     had n/v and  fainting after procedure. N/v continues    Past Surgical History  Procedure Laterality Date  . Left oophorectomy      intestine twisted around tube and ovary   . Abdominal hysterectomy    . Bartholin gland cyst excision    . Cervical fusion      plates and screws in neck    Current Outpatient Prescriptions  Medication Sig Dispense Refill  . ALPRAZolam (XANAX) 1 MG tablet Take 1 mg by mouth 4 (four) times daily.        . cyclobenzaprine (FLEXERIL) 10 MG tablet Take 10 mg by mouth at bedtime as needed. For muscle spasm       . metoCLOPramide (REGLAN) 10 MG tablet Take 5-10 mg by mouth 2 (two) times daily as needed. For nausea       . vitamin B-12 (CYANOCOBALAMIN) 1000 MCG tablet Take 2,000 mcg by mouth daily.        . diclofenac (CATAFLAM) 50 MG tablet Take 1 tablet (50 mg total) by mouth 3 (three) times daily.  21 tablet  0  . docusate sodium (COLACE) 100 MG capsule Take 100 mg by mouth 2 (two) times daily as needed. For constipation       . HYDROcodone-acetaminophen (NORCO/VICODIN) 5-325 MG  per tablet Take 1 tablet by mouth every 4 (four) hours as needed for pain.  15 tablet  0   No current facility-administered medications for this visit.   Review of patient's allergies indicates no known allergies. History reviewed. No pertinent family history. Social History:   reports that she quit smoking about 3 weeks ago. She has never used smokeless tobacco. She reports that she does not drink alcohol or use illicit drugs.   REVIEW OF SYSTEMS - PERTINENT POSITIVES ONLY: anxiety  Physical Exam:   Blood pressure 122/74, pulse 96, temperature 98.1 F (36.7 C), temperature source Temporal, resp. rate 14, height 5\' 2"  (1.575 m), weight 174 lb 3.2 oz (79.017 kg). Body mass index is 31.85 kg/(m^2).  Gen:  WDWN white female NAD  Neurological: Alert and oriented to person, place, and time. Motor and sensory function is grossly intact  Head: Normocephalic and atraumatic.  Eyes: Conjunctivae are normal. Pupils are equal, round, and reactive to light. No scleral icterus.  Neck: Normal range of motion. Neck supple. No tracheal deviation or thyromegaly present.  Cardiovascular:  SR without murmurs or gallops.  No carotid bruits Respiratory:  Effort normal.  No respiratory distress. No chest wall tenderness. Breath sounds normal.  No wheezes, rales or rhonchi.  Abdomen:  Prior abdominoplasty GU: Musculoskeletal: Normal range of motion. Extremities are nontender. No cyanosis, edema or clubbing noted Lymphadenopathy: No cervical, preauricular, postauricular or axillary adenopathy is present Skin: Skin is warm and dry. No rash noted. No diaphoresis. No erythema. No pallor. Pscyh: Normal mood and affect. Behavior is normal. Judgment and thought content normal.   LABORATORY RESULTS: No results found for this or any previous visit (from the past 48 hour(s)).  RADIOLOGY RESULTS: No results found.  Problem List: Patient Active Problem List   Diagnosis Date Noted  . Obesity BMI 31 05/17/2013     Assessment & Plan: Low BMI patient desiring lap band. We'll give her a lap band block but I want her to undergo a psychological evaluation and also a dietary consultation.  Will then move toward consideration for Lapband surgery    Matt B. Daphine Deutscher, MD, [facility] [phone] beeper [phone]  05/17/2013 4:13 PM

## 2013-05-17 NOTE — Patient Instructions (Signed)
Laparoscopic Gastric Band Surgery Care After These instructions give you information on caring for yourself after your procedure. Your doctor may also give you more specific instructions. Call your doctor if you have any problems or questions after your procedure. HOME CARE   Take walks throughout the day. Do not sit for longer than 1 hour while awake for 4 to 6 weeks.  You may shower 2 days after surgery. Pat the surgery cuts (incisions) dry. Do not rub the surgery cuts.  Do your coughing and deep breathing exercises.  Do not lift, push, or pull anything heavy until your doctor says it is okay.  Only take medicines as told by your doctor. Do not drive while taking pain medicine.  Drink plenty of fluids to keep your pee (urine) clear or pale yellow.  Stay on a clear liquid diet as long as your doctor tells you to.  Do not drink caffeine for 1 month.  Change bandages (dressings) as told by your doctor.  Check your surgery cuts for redness, pufffiness (swelling), abnormal coloring, fluid, or bleeding.  Follow your doctor's advice about vitamin and protein needs after surgery. GET HELP RIGHT AWAY IF:  You feel sick to your stomach (nauseous) and throw up (vomit).  You have pain and discomfort with swallowing.  You develop shortness of breath or difficulty breathing.  You have pain, puffiness, or feel warmth on your lower body.  You have very bad calf pain or pain not relieved by medicine.  You have a temperature by mouth above 102 F (38.9 C).  Your surgery cuts look red, puffy, or they leak fluid.  Your poop (stool) is black, tarry, or dark red.  You have chills.  You have chest pain.  You feel confused.  You have slurred speech.  You feel lightheaded when standing.  You suddenly feel weak.  You have any questions or concerns. MAKE SURE YOU:   Understand these instructions.  Will watch your condition.  Will get help right away if you are not doing well or  get worse. Document Released: 08/27/2010 Document Revised: 10/17/2011 Document Reviewed: 08/27/2010 [facility] Patient Information 2014 [place], [place].

## 2013-06-17 ENCOUNTER — Ambulatory Visit: Payer: Self-pay | Admitting: *Deleted

## 2013-06-19 ENCOUNTER — Ambulatory Visit: Payer: Self-pay | Admitting: Dietician

## 2013-07-16 ENCOUNTER — Ambulatory Visit: Payer: Self-pay | Admitting: Dietician

## 2013-09-08 ENCOUNTER — Emergency Department (HOSPITAL_COMMUNITY)
Admission: EM | Admit: 2013-09-08 | Discharge: 2013-09-08 | Disposition: A | Discharge status: Home / Self Care | Payer: BC Managed Care – PPO | Attending: Emergency Medicine | Admitting: Emergency Medicine

## 2013-09-08 ENCOUNTER — Encounter (HOSPITAL_COMMUNITY): Payer: Self-pay | Admitting: Emergency Medicine

## 2013-09-08 DIAGNOSIS — S42309D Unspecified fracture of shaft of humerus, unspecified arm, subsequent encounter for fracture with routine healing: Secondary | ICD-10-CM | POA: Insufficient documentation

## 2013-09-08 DIAGNOSIS — G8929 Other chronic pain: Secondary | ICD-10-CM | POA: Insufficient documentation

## 2013-09-08 DIAGNOSIS — S4292XA Fracture of left shoulder girdle, part unspecified, initial encounter for closed fracture: Secondary | ICD-10-CM

## 2013-09-08 DIAGNOSIS — M79609 Pain in unspecified limb: Secondary | ICD-10-CM | POA: Insufficient documentation

## 2013-09-08 DIAGNOSIS — F17200 Nicotine dependence, unspecified, uncomplicated: Secondary | ICD-10-CM | POA: Insufficient documentation

## 2013-09-08 DIAGNOSIS — M79603 Pain in arm, unspecified: Secondary | ICD-10-CM

## 2013-09-08 DIAGNOSIS — Z79899 Other long term (current) drug therapy: Secondary | ICD-10-CM | POA: Insufficient documentation

## 2013-09-08 MED ORDER — HYDROMORPHONE HCL 2 MG PO TABS: 2.0000 mg | ORAL_TABLET | ORAL | Status: DC | PRN

## 2013-09-08 NOTE — ED Provider Notes (Signed)
CSN: [id number]     Arrival date & time 09/08/13  1700 History  This chart was scribed for non-physician practitioner, Arthor Captain, PA-C working with Lyanne Co, MD by Greggory Stallion, ED scribe. This patient was seen in room WTR5/WTR5 and the patient's care was started at 5:28 PM.   Chief Complaint  Patient presents with  . Arm Pain   The history is provided by the patient. No language interpreter was used.   HPI Comments: [name] is a 48 y.o. female who presents to the Emergency Department complaining of gradual onset, constant left arm pain that started 2 weeks ago. She was originally seen for it at the onset of pain and was told she had a humeral fracture. Pt has seen her orthopedist about her arm and was given 5 mg Roxicodone and states it has provided no relief. She is currently requesting pain control.   Orthopedist is Dr. Ave Filter  Past Medical History  Diagnosis Date  . Cysts of both ovaries   . Chronic back pain     had n/v and  fainting after procedure. N/v continues   Past Surgical History  Procedure Laterality Date  . Left oophorectomy      intestine twisted around tube and ovary   . Abdominal hysterectomy    . Bartholin gland cyst excision    . Cervical fusion      plates and screws in neck   No family history on file. History  Substance Use Topics  . Smoking status: Current Every Day Smoker    Types: Cigarettes  . Smokeless tobacco: Never Used  . Alcohol Use: No   OB History   Grav Para Term Preterm Abortions TAB SAB Ect Mult Living   2         2     Review of Systems  Constitutional: Negative for fever.  HENT: Negative for congestion.   Eyes: Negative for redness.  Respiratory: Negative for shortness of breath.   Cardiovascular: Negative for chest pain.  Gastrointestinal: Negative for abdominal pain.  Musculoskeletal: Positive for myalgias.  Skin: Negative for rash.  Neurological: Negative for speech difficulty.  Psychiatric/Behavioral:  Negative for confusion.    Allergies  Review of patient's allergies indicates no known allergies.  Home Medications   Current Outpatient Rx  Name  Route  Sig  Dispense  Refill  . ALPRAZolam (XANAX) 1 MG tablet   Oral   Take 1 mg by mouth 4 (four) times daily.           . cyclobenzaprine (FLEXERIL) 10 MG tablet   Oral   Take 10 mg by mouth at bedtime as needed. For muscle spasm          . diclofenac (CATAFLAM) 50 MG tablet   Oral   Take 1 tablet (50 mg total) by mouth 3 (three) times daily.   21 tablet   0   . docusate sodium (COLACE) 100 MG capsule   Oral   Take 100 mg by mouth 2 (two) times daily as needed. For constipation          . HYDROcodone-acetaminophen (NORCO/VICODIN) 5-325 MG per tablet   Oral   Take 1 tablet by mouth every 4 (four) hours as needed for pain.   15 tablet   0   . metoCLOPramide (REGLAN) 10 MG tablet   Oral   Take 5-10 mg by mouth 2 (two) times daily as needed. For nausea          .  vitamin B-12 (CYANOCOBALAMIN) 1000 MCG tablet   Oral   Take 2,000 mcg by mouth daily.            BP 145/96  Pulse 104  Temp(Src) 97.7 F (36.5 C) (Oral)  Resp 16  SpO2 99%  Physical Exam  Nursing note and vitals reviewed. Constitutional: She is oriented to person, place, and time. She appears well-developed and well-nourished. No distress.  HENT:  Head: Normocephalic and atraumatic.  Eyes: EOM are normal.  Neck: Neck supple. No tracheal deviation present.  Cardiovascular: Normal rate.   Pulses:      Radial pulses are 2+ on the right side, and 2+ on the left side.  Pulmonary/Chest: Effort normal. No respiratory distress.  Musculoskeletal: Normal range of motion.  No paraesthesias.   Neurological: She is alert and oriented to person, place, and time.  Skin: Skin is warm and dry.  Psychiatric: She has a normal mood and affect. Her behavior is normal.    ED Course  Procedures (including critical care time)  DIAGNOSTIC STUDIES: Oxygen  Saturation is 99% on RA, normal by my interpretation.    COORDINATION OF CARE: 5:39 PM-Discussed treatment plan which includes a short course of dilaudid with pt at bedside and pt agreed to plan. Advised pt to follow up with her orthopedist in the next few days.   Labs Review Labs Reviewed - No data to display Imaging Review No results found.  EKG Interpretation   None       MDM   1. Shoulder fracture, left   2. Arm pain     Patient with pain uncontrolled at home. Will increase her pain meds. She is to f/u with her orthopedist. Return precautions discussed. No signs of compartment syndrome.  I personally performed the services described in this documentation, which was scribed in my presence. The recorded information has been reviewed and is accurate.    Arthor CaptainAbigail Coriana Angello, PA-C 09/11/13 1135

## 2013-09-08 NOTE — ED Notes (Signed)
Pt from home c/oL arm pain. Pt had L arm fx approx 2 weeks ago. Pt pain meds are not relieving pain, pt wants to be seen for pain control. Pt is A&O and in NAD

## 2013-09-08 NOTE — Discharge Instructions (Signed)
Please follow up tomorrow with Dr. Ave Filterhandler  Shoulder Fracture (Proximal Humerus or Glenoid) A shoulder fracture is a broken upper arm bone or a broken socket bone. The humerus is the upper arm bone and the glenoid is the shoulder socket. Proximal means the humerus is broken near the shoulder. Most of the time the bones of a broken shoulder are in an acceptable position. Usually, the injury can be treated with a shoulder immobilizer or sling and swath bandage. These devices support the arm and prevent any shoulder movement. If the bones are not in a good position, then surgery is sometimes needed. Shoulder fractures usually initially cause swelling, pain, and discoloration around the upper arm. They heal in 8 to 12 weeks with proper treatment. SYMPTOMS  At the time of injury:  Pain.  Tenderness.  Regular body contours are not normal. Later symptoms may include:  Swelling and bruising of the elbow and hand.  Swelling and bruising of the arm or chest. Other symptoms include:  Pain when lifting or turning the arm.  Paralysis below the fracture.  Numbness or coldness below the fracture. CAUSES   Indirect force from falling on an outstretched arm.  A blow to the shoulder. RISK INCREASES WITH:  Not being in shape.  Playing contact sports, such as football, soccer, hockey, or rugby.  Sports where falling on an outstretched arm occurs, such as basketball, skateboarding, or volleyball.  History of bone or joint disease.  History of shoulder injury. PREVENTION  Warm up before activity.  Stretch before activity.  Stay in shape with your:  Heart fitness.  Flexibility.  Shoulder Strength.  Falling with the proper technique. PROGNOSIS  In adults, healing time is about 7 weeks. For children, healing time is about 5 weeks. Surgery may be needed. RELATED COMPLICATIONS  The bones do not heal together (nonunion).  The bones do not align properly when they heal  (malunion).  Long-term problems with pain, stiffness, swelling, or loss of motion.  The injured arm heals shorter than the other.  Nerves are injured in the arm.  Arthritis in the shoulder.  Normal bone growth is interrupted in children.  Blood supply to the shoulder joint is diminished. TREATMENT If the bones are aligned, then initial treatment will be with ice and medicine to help with pain. The shoulder will be held in place with a sling (immobilization). The shoulder will be allowed to heal for up to 6 weeks. Injuries that may need surgery include:  Severe fractures.  Fractures that are not in appropriate alignment (displaced).  Non-displaced fractures (not common). Surgery helps the bones align correctly. The bones may be held in place with:  Sutures.  Wires.  Rods.  Plates.  Screws.  Pins. If you have had surgery or not, you will likely be assisted by a physical therapist or athletic trainer to get the best results with your injured shoulder. This will likely include exercises to strengthen and stretch the injured and surrounding areas. MEDICATION  If pain medicine is needed, nonsteroidal anti-inflammatory medicines (such as aspirin or ibuprofen) or other minor pain relievers (such as acetaminophen) are often advised.  Do not take pain medicine for 7 days before surgery.  Stronger pain relievers may be prescribed. Use only as directed and take only as much as you need. COLD THERAPY Cold treatment (icing) relieves pain and reduces inflammation. Cold treatment should be applied for 10 to 15 minutes every 2 to 3 hours, and immediately after activity that aggravates your symptoms. Use  ice packs or an ice massage. SEEK IMMEDIATE MEDICAL CARE IF:  You have severe shoulder pain unrelieved by rest and taking pain medicine.  You have pain, numbness, tingling, or weakness in the hand or wrist.  You have shortness of breath, chest pain, severe weakness, or  fainting.  You have severe pain with motion of the fingers or wrist.  Blue, gray, or dark color appears in the fingernails on injured extremity. Document Released: 07/25/2005 Document Revised: 10/17/2011 Document Reviewed: 11/06/2008 [facility] Patient Information 2014 [place], [place].

## 2013-09-12 NOTE — ED Provider Notes (Signed)
Medical screening examination/treatment/procedure(s) were performed by non-physician practitioner and as supervising physician I was immediately available for consultation/collaboration.  EKG Interpretation   None         Jadin Creque M Cortnie Ringel, MD 09/12/13 0305 

## 2013-10-13 ENCOUNTER — Encounter (HOSPITAL_COMMUNITY): Payer: Self-pay | Admitting: Emergency Medicine

## 2013-10-13 ENCOUNTER — Emergency Department (HOSPITAL_COMMUNITY)
Admission: EM | Admit: 2013-10-13 | Discharge: 2013-10-13 | Disposition: A | Discharge status: Home / Self Care | Payer: BC Managed Care – PPO | Attending: Emergency Medicine | Admitting: Emergency Medicine

## 2013-10-13 ENCOUNTER — Emergency Department (HOSPITAL_COMMUNITY): Payer: BC Managed Care – PPO

## 2013-10-13 DIAGNOSIS — S4980XA Other specified injuries of shoulder and upper arm, unspecified arm, initial encounter: Secondary | ICD-10-CM | POA: Insufficient documentation

## 2013-10-13 DIAGNOSIS — F17200 Nicotine dependence, unspecified, uncomplicated: Secondary | ICD-10-CM | POA: Insufficient documentation

## 2013-10-13 DIAGNOSIS — IMO0002 Reserved for concepts with insufficient information to code with codable children: Secondary | ICD-10-CM | POA: Insufficient documentation

## 2013-10-13 DIAGNOSIS — Y929 Unspecified place or not applicable: Secondary | ICD-10-CM | POA: Insufficient documentation

## 2013-10-13 DIAGNOSIS — Y939 Activity, unspecified: Secondary | ICD-10-CM | POA: Insufficient documentation

## 2013-10-13 DIAGNOSIS — Z8742 Personal history of other diseases of the female genital tract: Secondary | ICD-10-CM | POA: Insufficient documentation

## 2013-10-13 DIAGNOSIS — S46909A Unspecified injury of unspecified muscle, fascia and tendon at shoulder and upper arm level, unspecified arm, initial encounter: Secondary | ICD-10-CM | POA: Insufficient documentation

## 2013-10-13 DIAGNOSIS — M25519 Pain in unspecified shoulder: Secondary | ICD-10-CM

## 2013-10-13 DIAGNOSIS — Z79899 Other long term (current) drug therapy: Secondary | ICD-10-CM | POA: Insufficient documentation

## 2013-10-13 MED ORDER — OXYCODONE HCL 5 MG PO TABS: 5.0000 mg | ORAL_TABLET | Freq: Four times a day (QID) | ORAL | Status: DC | PRN

## 2013-10-13 MED ORDER — OXYCODONE HCL 5 MG PO TABS
5.0000 mg | ORAL_TABLET | Freq: Four times a day (QID) | ORAL | Status: DC | PRN
  Administered 2013-10-13: 10 mg via ORAL
  Filled 2013-10-13: qty 2

## 2013-10-13 NOTE — ED Notes (Signed)
Pt arrived to the ED with a complaint of shoulder pain.  Pt broke her shoulder 7 weeks ago and last night she hit her shoulder on the storm door and has had increased pain since.  Pt has had oxycodone for pain but has run out of her prescription.  Pt states she has been unable to sleep last night and has been unable to get comfortable today.

## 2013-10-13 NOTE — Discharge Instructions (Signed)
Make sure to call Dr. Ave Filterhandler tomorrow to set an appointment for further evaluation

## 2013-10-13 NOTE — ED Provider Notes (Signed)
Medical screening examination/treatment/procedure(s) were performed by non-physician practitioner and as supervising physician I was immediately available for consultation/collaboration.   EKG Interpretation None       Doug SouSam Keval Nam, MD 10/13/13 2336

## 2013-10-13 NOTE — ED Provider Notes (Signed)
CSN: [id number]     Arrival date & time 10/13/13  1921 History  This chart was scribed for non-physician practitioner Marcelle SmilingGail K Shulz, NP, working with Doug SouSam Jacubowitz, MD, by Yevette EdwardsAngela Bracken, ED Scribe. This patient was seen in room WTR6/WTR6 and the patient's care was started at 8:07 PM.  First MD Initiated Contact with Patient 10/13/13 1954     Chief Complaint  Patient presents with  . Shoulder Pain    The history is provided by the patient and the spouse. No language interpreter was used.   HPI Comments: [name] is a 48 y.o. female who presents to the Emergency Department complaining of left shoulder pain which increased yesterday when a storm door hit her left shoulder and back. The pt reports the shoulder pain prevented her from sleeping well yesterday evening; she has treated the pain with heat, ice, and oxycodone. However, the pt's oxycodone prescription has run out. She reports the shoulder was broken seven weeks ago; Dr. Ave Filterhandler treated her shoulder, but she did not have surgery. She had an appointment with Dr. Ave Filterhandler tomorrow, but it was cancelled. She was referred to the ED when she called Dr. Veda Canninghandler's after-hours number. She states she has been taking the sling off for approximately an hour a day to prevent a frozen elbow.   The pt is left-handed.   Past Medical History  Diagnosis Date  . Cysts of both ovaries   . Chronic back pain     had n/v and  fainting after procedure. N/v continues   Past Surgical History  Procedure Laterality Date  . Left oophorectomy      intestine twisted around tube and ovary   . Abdominal hysterectomy    . Bartholin gland cyst excision    . Cervical fusion      plates and screws in neck   History reviewed. No pertinent family history. History  Substance Use Topics  . Smoking status: Current Every Day Smoker    Types: Cigarettes  . Smokeless tobacco: Never Used  . Alcohol Use: No   OB History   Grav Para Term Preterm Abortions TAB  SAB Ect Mult Living   2         2     Review of Systems  Musculoskeletal: Positive for arthralgias. Negative for joint swelling and neck pain.  Neurological: Negative for weakness and numbness.  All other systems reviewed and are negative.   Allergies  Corticosteroids and Nsaids  Home Medications   Current Outpatient Rx  Name  Route  Sig  Dispense  Refill  . ALPRAZolam (XANAX) 1 MG tablet   Oral   Take 1 mg by mouth 4 (four) times daily.           . cyclobenzaprine (FLEXERIL) 10 MG tablet   Oral   Take 10 mg by mouth 3 (three) times daily. For muscle spasm         . HYDROmorphone (DILAUDID) 2 MG tablet   Oral   Take 1 tablet (2 mg total) by mouth every 4 (four) hours as needed.   6 tablet   0   . omeprazole (PRILOSEC) 20 MG capsule   Oral   Take 20 mg by mouth every morning.         Marland Kitchen. oxyCODONE (OXY IR/ROXICODONE) 5 MG immediate release tablet   Oral   Take 5-10 mg by mouth every 6 (six) hours as needed for severe pain.         .Marland Kitchen  oxyCODONE (OXY IR/ROXICODONE) 5 MG immediate release tablet   Oral   Take 1-2 tablets (5-10 mg total) by mouth every 6 (six) hours as needed for severe pain.   20 tablet   0   . ranitidine (ZANTAC) 150 MG tablet   Oral   Take 150 mg by mouth 2 (two) times daily.          Triage Vitals: BP 138/98  Pulse 98  Temp(Src) 97.7 F (36.5 C) (Oral)  Resp 18  SpO2 97%  Physical Exam  Nursing note and vitals reviewed. Constitutional: She is oriented to person, place, and time. She appears well-developed and well-nourished. No distress.  HENT:  Head: Normocephalic and atraumatic.  Mouth/Throat: Oropharynx is clear and moist.  Eyes: EOM are normal. Pupils are equal, round, and reactive to light.  Neck: Normal range of motion. Neck supple.  Cardiovascular: Normal rate.   Pulmonary/Chest: Effort normal. No respiratory distress.  Musculoskeletal: She exhibits tenderness. She exhibits no edema.       Left shoulder: She exhibits  decreased range of motion, tenderness and pain. She exhibits no swelling, no effusion, no crepitus, no deformity, no spasm and normal pulse.       Arms: Neurological: She is alert and oriented to person, place, and time.  Skin: Skin is warm and dry.  Psychiatric: She has a normal mood and affect. Her behavior is normal.    ED Course  Procedures (including critical care time)  DIAGNOSTIC STUDIES: Oxygen Saturation is 97% on room air, normal by my interpretation.    COORDINATION OF CARE:  8:12 PM- Discussed treatment plan with patient, which includes pain medication, and the patient agreed to the plan. Advised the pt to follow-up with Dr. Ave Filter tomorrow.   Labs Review Labs Reviewed - No data to display Imaging Review No results found.   EKG Interpretation None     Hit posterior shoulder with storm door that closed quickly  Increased pain out of prescribed Oxycodone took OTC med applied heat and cold without relief.  Had appointment tomorrow that office cancelled Will call in AM to reset appointment for reevaluation  MDM   Final diagnoses:  Shoulder pain      I personally performed the services described in this documentation, which was scribed in my presence. The recorded information has been reviewed and is accurate.   Arman Filter, NP 10/13/13 2029

## 2013-12-06 ENCOUNTER — Ambulatory Visit: Payer: Self-pay | Admitting: Gastroenterology

## 2013-12-11 ENCOUNTER — Ambulatory Visit: Payer: Self-pay | Admitting: Bariatrics

## 2013-12-31 ENCOUNTER — Ambulatory Visit: Payer: Self-pay | Admitting: Bariatrics

## 2014-01-07 ENCOUNTER — Inpatient Hospital Stay: Payer: Self-pay | Admitting: Bariatrics

## 2014-01-07 LAB — CREATININE, SERUM
Creatinine: 0.74 mg/dL (ref 0.60–1.30)
EGFR (African American): >60

## 2014-01-07 LAB — PLATELET COUNT: PLATELETS: 183 10*3/uL (ref 150–440)

## 2014-01-08 LAB — CBC WITH DIFFERENTIAL/PLATELET
BASOS ABS: 0.1 10*3/uL (ref 0.0–0.1)
BASOS PCT: 0.4 %
EOS ABS: 0 10*3/uL (ref 0.0–0.7)
Eosinophil %: 0 %
HCT: 38.7 % (ref 35.0–47.0)
HGB: 12.9 g/dL (ref 12.0–16.0)
Lymphocyte #: 1.4 10*3/uL (ref 1.0–3.6)
Lymphocyte %: 8.7 %
MCH: 30.5 pg (ref 26.0–34.0)
MCHC: 33.2 g/dL (ref 32.0–36.0)
MCV: 92 fL (ref 80–100)
MONOS PCT: 4.2 %
Monocyte #: 0.7 x10 3/mm (ref 0.2–0.9)
NEUTROS ABS: 14 10*3/uL — AB (ref 1.4–6.5)
Neutrophil %: 86.7 %
PLATELETS: 210 10*3/uL (ref 150–440)
RBC: 4.22 10*6/uL (ref 3.80–5.20)
RDW: 13.5 % (ref 11.5–14.5)
WBC: 16.2 10*3/uL — ABNORMAL HIGH (ref 3.6–11.0)

## 2014-01-08 LAB — BASIC METABOLIC PANEL
Anion Gap: 6 — ABNORMAL LOW (ref 7–16)
BUN: 7 mg/dL (ref 7–18)
CO2: 26 mmol/L (ref 21–32)
Calcium, Total: 8.9 mg/dL (ref 8.5–10.1)
Chloride: 106 mmol/L (ref 98–107)
Creatinine: 0.94 mg/dL (ref 0.60–1.30)
GLUCOSE: 157 mg/dL — AB (ref 65–99)
OSMOLALITY: 277 (ref 275–301)
Potassium: 4.7 mmol/L (ref 3.5–5.1)
SODIUM: 138 mmol/L (ref 136–145)

## 2014-01-31 ENCOUNTER — Ambulatory Visit: Payer: Self-pay | Admitting: Bariatrics

## 2014-02-05 ENCOUNTER — Ambulatory Visit: Payer: Self-pay | Admitting: Bariatrics

## 2014-06-09 ENCOUNTER — Encounter (HOSPITAL_COMMUNITY): Payer: Self-pay | Admitting: Emergency Medicine

## 2014-10-14 ENCOUNTER — Other Ambulatory Visit: Payer: Self-pay | Admitting: Obstetrics and Gynecology

## 2014-10-15 ENCOUNTER — Other Ambulatory Visit (HOSPITAL_COMMUNITY): Payer: Self-pay | Admitting: Obstetrics and Gynecology

## 2014-10-15 DIAGNOSIS — R1032 Left lower quadrant pain: Secondary | ICD-10-CM

## 2014-10-15 LAB — CYTOLOGY - PAP

## 2014-10-17 ENCOUNTER — Ambulatory Visit (HOSPITAL_COMMUNITY): Payer: Self-pay

## 2014-10-29 ENCOUNTER — Ambulatory Visit (HOSPITAL_COMMUNITY): Admission: RE | Admit: 2014-10-29 | Payer: BLUE CROSS/BLUE SHIELD | Source: Ambulatory Visit

## 2014-10-31 ENCOUNTER — Ambulatory Visit (HOSPITAL_COMMUNITY): Admission: RE | Admit: 2014-10-31 | Payer: BLUE CROSS/BLUE SHIELD | Source: Ambulatory Visit

## 2014-11-04 ENCOUNTER — Ambulatory Visit (HOSPITAL_COMMUNITY): Admission: RE | Admit: 2014-11-04 | Payer: BLUE CROSS/BLUE SHIELD | Source: Ambulatory Visit

## 2014-11-11 ENCOUNTER — Ambulatory Visit (HOSPITAL_COMMUNITY): Payer: BLUE CROSS/BLUE SHIELD

## 2014-11-29 NOTE — Op Note (Signed)
PATIENT NAME:  [name], [name] MR#:  [id number] DATE OF BIRTH:  04-03-1966  DATE OF PROCEDURE:  01/07/2014  PROCEDURE PERFORMED:  Laparoscopic repair of hiatal hernia with relaxing incision in the right hemidiaphragm and xenograft mesh coverage of iatrogenic defect.  A partial fundoplication and lateral gastric imbrication.   PHYSICIANS IN ATTENDANCE:  Casimiro Needle A. Alva Garnet, M.D.   ASSISTANT:  Anabel Halon, PA.   PREOPERATIVE DIAGNOSES:   1.  Long-standing gastroesophageal reflux disease with associated presence of a hiatal hernia.   2.  Long-standing obesity.  POSTOPERATIVE DIAGNOSES:   1.  Long-standing gastroesophageal reflux disease with associated presence of a hiatal hernia.   2.  Long-standing obesity.   DESCRIPTION OF PROCEDURE:  The patient was brought to the Operating Room and placed in the supine position.  General anesthesia was obtained with orotracheal intubation.  The patient had TED hose and Thromboguards applied.  She was placed in the low lithotomy position with Allen stirrups.  The patient had a Foley catheter inserted sterilely.  The chest and abdomen were sterilely prepped and draped.  A 5 mm Optiview trocar was introduced under direct visualization in the left upper abdomen.  Pneumoperitoneum obtained with carbon dioxide.  Three additional trocars are introduced across the upper abdomen and a Nathanson liver retractor introduced through a subxiphoid defect.  On inspection of the upper abdomen it was noted to be approximately 2 cm hiatal hernia.  The patient had division of the gastrohepatic ligament, followed by division of the peritoneum across the anterior hiatus.  Blunt dissection was then used to reduce herniated peritoneum and the anterior esophagus and anterior vagal nerve away from the overlying pericardium.  Next, the peritoneum was incised just lateral to the right crus and blunt dissection was used to reduce the herniated lesser sac fatty tissue in the abdominal cavity.   Several short gastric vessels then divided by use of the Harmonic scalpel and the upper fundus freed from the undersurface of the left hemidiaphragm allowing full exposure of the left crus where the phrenoesophageal ligaments and peritoneum were incised just medial to the left crus.  This resulted in additional delivery of lesser sac fatty tissue.  Next, circumferential dissection of the lower esophagus performed freeing it from the aorta and the pleural surfaces on the right and the left side.  To assist with this, a Penrose drain was passed around the distal esophagus and used for traction.  Eventually circumferential dissection around the esophagus was performed up to a point in which the pulmonary vasculature was identified on both the right and left side.  As this was done, the posterior and anterior vagal nerves were swept away from the pleural surfaces.  On the patient's right side there was a small opening in the pleura which was managed by application of hemoclips.  After dissection we were able to deliver 4 cm of esophagus lying comfortably in the abdominal cavity as confirmed by tape measuring device.  The posterior crural repair was initiated after placement of a 54 French bougie into the stomach to be used for aid in sizing of the hiatal closure.  Three interrupted 0 Ethibond sutures were used to initiate the posterior crural repair.  However, with the upper sutures, there was noted to be increasing tension in the diaphragmatic musculature and fascia and it was decided to proceed with a relaxing incision of the right hemidiaphragm.  This was initiated at a point midway between the vena cava and the upper aspect of the crural margin.  The thickened fascia was divided by use of the Harmonic scalpel exposing the underlying peripleural fat pad.  The release was then extended inferiorly to a point parallel with the upper suture.  It was released in a curvilinear fashion superiorly following the margins of  the hiatus to a point in which the fascia was noted to be abutting the pericardium.  The iatrogenic defect noted to measure approximately 5 to 5.5 cm in length and it broadened to a diameter of approximately 2 cm.  This was covered with an exaggerated C-shaped porcine extracellular matrix with the base of the membrane being direct against the abdominal contents.  This was secured to the diaphragm with the use of 10 mm hernia stapler.  The patient then had creation of a fundoplication effect.  The upper fundus being delivered through the retroesophageal window.  The fundoplication was initiated at the level of the GE junction.  The new upper greater curvature of the stomach was brought into opposition with the retroesophageal portion of the stomach.  The initial suture did include a portion of the anterior esophagus at a point away from the anterior vagal nerve which was identified.  Next, sutures were placed at 2 and 10 o'clock position on each side of the midline of the esophagus.  Two sutures being placed on the right and two on the left side creating a more partial effect above the initial suture.  Next, the patient had division of residual short gastric vessels and division of portions of the gastro- colic ligament, sparing the distal aspect of the gastroepiploic vessels.  The patient then had an in-folding of the upper lateral aspect of the stomach with running 2-0 Ethibond suture.  This was initiated approximately 1 cm on either side of the vascular pedicle line.  A second layer of sutures were placed with additional imbrication in the mid and lower body of the stomach and also the lower fundus with care taken to avoid increasing pressure at the site of the fundoplication.  The latter process performed in an effort to aid the patient in increasing early satiety.  She has struggled with her weight over a prolonged period of time.  Following confirmation of hemostasis, the pneumoperitoneum relieved.  The trocars  were removed.  The wounds was injected with 0.25% Marcaine and closed with 4-0 Monocryl in the dermis followed by Dermabond.     ____________________________ Tyrone AppleMichael A. Alva Garnetyner, MD mat:ea D: 01/08/2014 23:07:12 ET T: 01/09/2014 00:45:10 ET JOB#: [id number]  cc: Casimiro NeedleMichael A. Alva Garnetyner, MD, <Dictator> Everette RankMICHAEL A TYNER MD ELECTRONICALLY SIGNED 01/09/2014 17:05

## 2015-01-21 ENCOUNTER — Encounter (HOSPITAL_COMMUNITY): Payer: Self-pay | Admitting: Emergency Medicine

## 2015-01-21 ENCOUNTER — Emergency Department (HOSPITAL_COMMUNITY)
Admission: EM | Admit: 2015-01-21 | Discharge: 2015-01-21 | Disposition: A | Discharge status: Home / Self Care | Payer: BLUE CROSS/BLUE SHIELD | Source: Home / Self Care | Attending: Family Medicine | Admitting: Family Medicine

## 2015-01-21 DIAGNOSIS — N390 Urinary tract infection, site not specified: Secondary | ICD-10-CM

## 2015-01-21 LAB — POCT URINALYSIS DIP (DEVICE)
BILIRUBIN URINE: NEGATIVE
GLUCOSE, UA: NEGATIVE mg/dL
Ketones, ur: NEGATIVE mg/dL
NITRITE: POSITIVE — AB
PH: 5.5 (ref 5.0–8.0)
Protein, ur: NEGATIVE mg/dL
Specific Gravity, Urine: 1.025 (ref 1.005–1.030)
Urobilinogen, UA: 0.2 mg/dL (ref 0.0–1.0)

## 2015-01-21 MED ORDER — CEPHALEXIN 500 MG PO CAPS: 500.0000 mg | ORAL_CAPSULE | Freq: Two times a day (BID) | ORAL | Status: DC

## 2015-01-21 NOTE — Discharge Instructions (Signed)
Thank you for coming in today. If your belly pain worsens, or you have high fever, bad vomiting, blood in your stool or black tarry stool go to the Emergency Room.   Urinary Tract Infection Urinary tract infections (UTIs) can develop anywhere along your urinary tract. Your urinary tract is your body's drainage system for removing wastes and extra water. Your urinary tract includes two kidneys, two ureters, a bladder, and a urethra. Your kidneys are a pair of bean-shaped organs. Each kidney is about the size of your fist. They are located below your ribs, one on each side of your spine. CAUSES Infections are caused by microbes, which are microscopic organisms, including fungi, viruses, and bacteria. These organisms are so small that they can only be seen through a microscope. Bacteria are the microbes that most commonly cause UTIs. SYMPTOMS  Symptoms of UTIs may vary by age and gender of the patient and by the location of the infection. Symptoms in young women typically include a frequent and intense urge to urinate and a painful, burning feeling in the bladder or urethra during urination. Older women and men are more likely to be tired, shaky, and weak and have muscle aches and abdominal pain. A fever may mean the infection is in your kidneys. Other symptoms of a kidney infection include pain in your back or sides below the ribs, nausea, and vomiting. DIAGNOSIS To diagnose a UTI, your caregiver will ask you about your symptoms. Your caregiver also will ask to provide a urine sample. The urine sample will be tested for bacteria and white blood cells. White blood cells are made by your body to help fight infection. TREATMENT  Typically, UTIs can be treated with medication. Because most UTIs are caused by a bacterial infection, they usually can be treated with the use of antibiotics. The choice of antibiotic and length of treatment depend on your symptoms and the type of bacteria causing your  infection. HOME CARE INSTRUCTIONS  If you were prescribed antibiotics, take them exactly as your caregiver instructs you. Finish the medication even if you feel better after you have only taken some of the medication.  Drink enough water and fluids to keep your urine clear or pale yellow.  Avoid caffeine, tea, and carbonated beverages. They tend to irritate your bladder.  Empty your bladder often. Avoid holding urine for long periods of time.  Empty your bladder before and after sexual intercourse.  After a bowel movement, women should cleanse from front to back. Use each tissue only once. SEEK MEDICAL CARE IF:   You have back pain.  You develop a fever.  Your symptoms do not begin to resolve within 3 days. SEEK IMMEDIATE MEDICAL CARE IF:   You have severe back pain or lower abdominal pain.  You develop chills.  You have nausea or vomiting.  You have continued burning or discomfort with urination. MAKE SURE YOU:   Understand these instructions.  Will watch your condition.  Will get help right away if you are not doing well or get worse. Document Released: 05/04/2005 Document Revised: 01/24/2012 Document Reviewed: 09/02/2011 Exit[employer] Patient Information 2015 [employer]. This information is not intended to replace advice given to you by your health care provider. Make sure you discuss any questions you have with your health care provider.  

## 2015-01-21 NOTE — ED Provider Notes (Signed)
[name] is a 49 y.o. female who presents to Urgent Care today for urinary frequency and dysuria associated with pelvic and right low back pain. Symptoms present for one day. Symptoms are consistent with previous episodes of UTI. No fevers chills nausea vomiting or diarrhea. No treatment tried yet.   Past Medical History  Diagnosis Date  . Cysts of both ovaries   . Chronic back pain     had n/v and  fainting after procedure. N/v continues   Past Surgical History  Procedure Laterality Date  . Left oophorectomy      intestine twisted around tube and ovary   . Abdominal hysterectomy    . Bartholin gland cyst excision    . Cervical fusion      plates and screws in neck   History  Substance Use Topics  . Smoking status: Current Every Day Smoker    Types: Cigarettes  . Smokeless tobacco: Never Used  . Alcohol Use: No   ROS as above Medications: No current facility-administered medications for this encounter.   Current Outpatient Prescriptions  Medication Sig Dispense Refill  . ALPRAZolam (XANAX) 1 MG tablet Take 1 mg by mouth 4 (four) times daily.      . methadone (DOLOPHINE) 10 MG tablet Take 10 mg by mouth every 8 (eight) hours.    Marland Kitchen omeprazole (PRILOSEC) 20 MG capsule Take 20 mg by mouth every morning.    . ranitidine (ZANTAC) 150 MG tablet Take 150 mg by mouth 2 (two) times daily.    . traMADol (ULTRAM) 50 MG tablet Take by mouth every 6 (six) hours as needed.    . cephALEXin (KEFLEX) 500 MG capsule Take 1 capsule (500 mg total) by mouth 2 (two) times daily. 14 capsule 0  . cyclobenzaprine (FLEXERIL) 10 MG tablet Take 10 mg by mouth 3 (three) times daily. For muscle spasm    . HYDROmorphone (DILAUDID) 2 MG tablet Take 1 tablet (2 mg total) by mouth every 4 (four) hours as needed. 6 tablet 0  . oxyCODONE (OXY IR/ROXICODONE) 5 MG immediate release tablet Take 5-10 mg by mouth every 6 (six) hours as needed for severe pain.    Marland Kitchen oxyCODONE (OXY IR/ROXICODONE) 5 MG immediate  release tablet Take 1-2 tablets (5-10 mg total) by mouth every 6 (six) hours as needed for severe pain. 20 tablet 0   Allergies  Allergen Reactions  . Corticosteroids Other (See Comments)    REACTION: FACIAL RASH ,FEVER ,DIZZINESS, NAUSEA, LOSS OF SMELL AND TASTE  . Nsaids      Exam:  BP 164/98 mmHg  Pulse 103  Temp(Src) 98.3 F (36.8 C) (Oral)  Resp 16  SpO2 98% Gen: Well NAD HEENT: EOMI,  MMM Lungs: Normal work of breathing. CTABL Heart: Mild tachycardia no MRG Abd: NABS, Soft. Nondistended, mildly tender pelvis no rebound or guarding no CV angle tenderness to percussion Exts: Brisk capillary refill, warm and well perfused.   Results for orders placed or performed during the hospital encounter of 01/21/15 (from the past 24 hour(s))  POCT urinalysis dip (device)     Status: Abnormal   Collection Time: 01/21/15  7:04 PM  Result Value Ref Range   Glucose, UA NEGATIVE NEGATIVE mg/dL   Bilirubin Urine NEGATIVE NEGATIVE   Ketones, ur NEGATIVE NEGATIVE mg/dL   Specific Gravity, Urine 1.025 1.005 - 1.030   Hgb urine dipstick LARGE (A) NEGATIVE   pH 5.5 5.0 - 8.0   Protein, ur NEGATIVE NEGATIVE mg/dL   Urobilinogen,  UA 0.2 0.0 - 1.0 mg/dL   Nitrite POSITIVE (A) NEGATIVE   Leukocytes, UA SMALL (A) NEGATIVE   No results found.  Assessment and Plan: 49 y.o. female with urinary tract infection. Culture pending. Treat with Keflex. AZO for comfort as needed.  Discussed warning signs or symptoms. Please see discharge instructions. Patient expresses understanding.     Rodolph Bong, MD 01/21/15 [id number]

## 2015-01-21 NOTE — ED Notes (Signed)
C/o UTI sx onset today around 1400 Sx include dysuria, abd pain, back pain, urinary freq/urgency Denies fevers, hematuria Alert, no signs of acute distress.

## 2015-02-11 LAB — URINE CULTURE: Culture: >=100000

## 2015-05-19 ENCOUNTER — Telehealth: Payer: Self-pay | Admitting: Family Medicine

## 2015-05-19 NOTE — Telephone Encounter (Signed)
Pt would like to know if you would be willing to take her on as a new pt?  °

## 2015-05-20 NOTE — Telephone Encounter (Signed)
Ok with me 

## 2015-05-21 NOTE — Telephone Encounter (Signed)
Left patient vm to call back to schedule.  °

## 2015-06-18 ENCOUNTER — Other Ambulatory Visit: Payer: Self-pay | Admitting: Obstetrics and Gynecology

## 2015-06-18 DIAGNOSIS — N644 Mastodynia: Secondary | ICD-10-CM

## 2015-06-24 ENCOUNTER — Ambulatory Visit: Payer: BLUE CROSS/BLUE SHIELD | Admitting: Internal Medicine

## 2015-07-08 ENCOUNTER — Ambulatory Visit: Payer: BLUE CROSS/BLUE SHIELD | Admitting: Internal Medicine

## 2015-07-30 ENCOUNTER — Telehealth: Payer: Self-pay | Admitting: Obstetrics and Gynecology

## 2015-07-31 NOTE — Telephone Encounter (Signed)
Error

## 2015-08-07 ENCOUNTER — Other Ambulatory Visit: Payer: BLUE CROSS/BLUE SHIELD

## 2015-08-18 ENCOUNTER — Encounter: Payer: Self-pay | Admitting: Endocrinology

## 2015-08-18 ENCOUNTER — Ambulatory Visit: Payer: BLUE CROSS/BLUE SHIELD | Admitting: Endocrinology

## 2015-08-18 ENCOUNTER — Ambulatory Visit (INDEPENDENT_AMBULATORY_CARE_PROVIDER_SITE_OTHER): Payer: BLUE CROSS/BLUE SHIELD | Admitting: Endocrinology

## 2015-08-18 VITALS — BP 118/82 | HR 106 | Temp 98.0°F | Ht 62.0 in | Wt 195.0 lb

## 2015-08-18 DIAGNOSIS — R635 Abnormal weight gain: Secondary | ICD-10-CM | POA: Diagnosis not present

## 2015-08-18 DIAGNOSIS — E038 Other specified hypothyroidism: Secondary | ICD-10-CM | POA: Diagnosis not present

## 2015-08-18 DIAGNOSIS — R5382 Chronic fatigue, unspecified: Secondary | ICD-10-CM

## 2015-08-18 DIAGNOSIS — F329 Major depressive disorder, single episode, unspecified: Secondary | ICD-10-CM

## 2015-08-18 DIAGNOSIS — E538 Deficiency of other specified B group vitamins: Secondary | ICD-10-CM

## 2015-08-18 DIAGNOSIS — F32A Depression, unspecified: Secondary | ICD-10-CM

## 2015-08-18 LAB — TSH: TSH: 2.35 u[IU]/mL (ref 0.35–4.50)

## 2015-08-18 LAB — T4, FREE: Free T4: 0.92 ng/dL (ref 0.60–1.60)

## 2015-08-18 MED ORDER — DEXAMETHASONE 1 MG PO TABS: ORAL_TABLET | ORAL | Status: DC
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
## 2015-08-18 NOTE — Patient Instructions (Signed)
Take the pill at bedtime before the test

## 2015-08-19 LAB — FOLLICLE STIMULATING HORMONE: FSH: 21.3 m[IU]/mL

## 2015-08-22 NOTE — Progress Notes (Signed)
Quick Note:  Please let patient know that the thyroid level is normal and will reassess further when she has follow-up labs without the medication ______

## 2015-08-24 ENCOUNTER — Other Ambulatory Visit: Payer: BLUE CROSS/BLUE SHIELD

## 2015-08-31 ENCOUNTER — Other Ambulatory Visit: Payer: BLUE CROSS/BLUE SHIELD

## 2015-09-05 DIAGNOSIS — F32A Depression, unspecified: Secondary | ICD-10-CM | POA: Insufficient documentation

## 2015-09-05 DIAGNOSIS — E559 Vitamin D deficiency, unspecified: Secondary | ICD-10-CM | POA: Insufficient documentation

## 2015-09-05 DIAGNOSIS — K5900 Constipation, unspecified: Secondary | ICD-10-CM | POA: Insufficient documentation

## 2015-09-05 DIAGNOSIS — F5104 Psychophysiologic insomnia: Secondary | ICD-10-CM | POA: Insufficient documentation

## 2015-09-05 DIAGNOSIS — F411 Generalized anxiety disorder: Secondary | ICD-10-CM | POA: Insufficient documentation

## 2015-09-05 DIAGNOSIS — M719 Bursopathy, unspecified: Secondary | ICD-10-CM | POA: Insufficient documentation

## 2015-09-05 DIAGNOSIS — R12 Heartburn: Secondary | ICD-10-CM | POA: Insufficient documentation

## 2015-09-05 DIAGNOSIS — M2550 Pain in unspecified joint: Secondary | ICD-10-CM | POA: Insufficient documentation

## 2015-09-07 ENCOUNTER — Encounter (HOSPITAL_COMMUNITY): Payer: Self-pay | Admitting: Emergency Medicine

## 2015-09-07 ENCOUNTER — Observation Stay (HOSPITAL_COMMUNITY)
Admission: EM | Admit: 2015-09-07 | Discharge: 2015-09-09 | Disposition: A | Discharge status: Home / Self Care | Payer: BLUE CROSS/BLUE SHIELD | Attending: General Surgery | Admitting: General Surgery

## 2015-09-07 ENCOUNTER — Emergency Department (HOSPITAL_COMMUNITY): Payer: BLUE CROSS/BLUE SHIELD

## 2015-09-07 DIAGNOSIS — E669 Obesity, unspecified: Secondary | ICD-10-CM | POA: Diagnosis not present

## 2015-09-07 DIAGNOSIS — R109 Unspecified abdominal pain: Secondary | ICD-10-CM | POA: Diagnosis present

## 2015-09-07 DIAGNOSIS — M549 Dorsalgia, unspecified: Secondary | ICD-10-CM | POA: Diagnosis not present

## 2015-09-07 DIAGNOSIS — K8012 Calculus of gallbladder with acute and chronic cholecystitis without obstruction: Secondary | ICD-10-CM | POA: Diagnosis not present

## 2015-09-07 DIAGNOSIS — G8929 Other chronic pain: Secondary | ICD-10-CM | POA: Diagnosis not present

## 2015-09-07 DIAGNOSIS — Z79891 Long term (current) use of opiate analgesic: Secondary | ICD-10-CM | POA: Diagnosis not present

## 2015-09-07 DIAGNOSIS — Z79899 Other long term (current) drug therapy: Secondary | ICD-10-CM | POA: Insufficient documentation

## 2015-09-07 DIAGNOSIS — F17210 Nicotine dependence, cigarettes, uncomplicated: Secondary | ICD-10-CM | POA: Diagnosis not present

## 2015-09-07 DIAGNOSIS — Z419 Encounter for procedure for purposes other than remedying health state, unspecified: Secondary | ICD-10-CM

## 2015-09-07 DIAGNOSIS — Z6836 Body mass index (BMI) 36.0-36.9, adult: Secondary | ICD-10-CM | POA: Insufficient documentation

## 2015-09-07 DIAGNOSIS — E039 Hypothyroidism, unspecified: Secondary | ICD-10-CM | POA: Diagnosis not present

## 2015-09-07 DIAGNOSIS — K810 Acute cholecystitis: Secondary | ICD-10-CM

## 2015-09-07 LAB — COMPREHENSIVE METABOLIC PANEL
ALBUMIN: 3.9 g/dL (ref 3.5–5.0)
ALK PHOS: 86 U/L (ref 38–126)
ALT: 17 U/L (ref 14–54)
AST: 20 U/L (ref 15–41)
Anion gap: 9 (ref 5–15)
BUN: 8 mg/dL (ref 6–20)
CALCIUM: 8.5 mg/dL — AB (ref 8.9–10.3)
CO2: 27 mmol/L (ref 22–32)
Chloride: 101 mmol/L (ref 101–111)
Creatinine, Ser: 0.62 mg/dL (ref 0.44–1.00)
GFR calc Af Amer: >60 mL/min (ref >60)
GFR calc non Af Amer: >60 mL/min (ref >60)
Glucose, Bld: 104 mg/dL — ABNORMAL HIGH (ref 65–99)
Potassium: 3.7 mmol/L (ref 3.5–5.1)
SODIUM: 137 mmol/L (ref 135–145)
Total Bilirubin: 0.4 mg/dL (ref 0.3–1.2)
Total Protein: 7.3 g/dL (ref 6.5–8.1)

## 2015-09-07 LAB — CBC
HCT: 41.9 % (ref 36.0–46.0)
HEMOGLOBIN: 13.7 g/dL (ref 12.0–15.0)
MCH: 31.3 pg (ref 26.0–34.0)
MCHC: 32.7 g/dL (ref 30.0–36.0)
MCV: 95.7 fL (ref 78.0–100.0)
Platelets: 251 10*3/uL (ref 150–400)
RBC: 4.38 MIL/uL (ref 3.87–5.11)
RDW: 13.2 % (ref 11.5–15.5)
WBC: 10.2 10*3/uL (ref 4.0–10.5)

## 2015-09-07 LAB — URINE MICROSCOPIC-ADD ON

## 2015-09-07 LAB — URINALYSIS, ROUTINE W REFLEX MICROSCOPIC
BILIRUBIN URINE: NEGATIVE
Glucose, UA: NEGATIVE mg/dL
Ketones, ur: NEGATIVE mg/dL
Leukocytes, UA: NEGATIVE
Nitrite: NEGATIVE
PH: 6.5 (ref 5.0–8.0)
PROTEIN: NEGATIVE mg/dL
Specific Gravity, Urine: 1.014 (ref 1.005–1.030)

## 2015-09-07 LAB — LIPASE, BLOOD: Lipase: 16 U/L (ref 11–51)

## 2015-09-07 MED ORDER — ONDANSETRON HCL 4 MG/2ML IJ SOLN
4.0000 mg | Freq: Once | INTRAMUSCULAR | Status: AC
  Administered 2015-09-07: 4 mg via INTRAVENOUS
  Filled 2015-09-07: qty 2

## 2015-09-07 MED ORDER — PROMETHAZINE HCL 25 MG/ML IJ SOLN
12.5000 mg | Freq: Four times a day (QID) | INTRAMUSCULAR | Status: DC | PRN
  Administered 2015-09-08 – 2015-09-09 (×2): 12.5 mg via INTRAVENOUS
  Filled 2015-09-07 (×2): qty 1

## 2015-09-07 MED ORDER — HYDROMORPHONE HCL 1 MG/ML IJ SOLN
0.5000 mg | Freq: Once | INTRAMUSCULAR | Status: AC
  Administered 2015-09-07: 0.5 mg via INTRAVENOUS
  Filled 2015-09-07: qty 1

## 2015-09-07 MED ORDER — PROMETHAZINE HCL 25 MG/ML IJ SOLN
12.5000 mg | Freq: Four times a day (QID) | INTRAMUSCULAR | Status: DC | PRN
Start: 2015-09-07 — End: 2015-09-07
  Administered 2015-09-07: 12.5 mg via INTRAVENOUS
  Filled 2015-09-07: qty 1

## 2015-09-07 MED ORDER — HYDROMORPHONE HCL 1 MG/ML IJ SOLN
1.0000 mg | INTRAMUSCULAR | Status: DC | PRN
  Administered 2015-09-07 – 2015-09-09 (×6): 1 mg via INTRAVENOUS
  Filled 2015-09-07 (×6): qty 1

## 2015-09-07 NOTE — ED Notes (Signed)
Pt c/o SOB to registration. Pt pulled into triage room for vitals to be rechecked and EKG.

## 2015-09-07 NOTE — ED Provider Notes (Signed)
CSN: [id number]     Arrival date & time 09/07/15  1713 History   First MD Initiated Contact with Patient 09/07/15 1934     Chief Complaint  Patient presents with  . Abdominal Pain     (Consider location/radiation/quality/duration/timing/severity/associated sxs/prior Treatment) HPI Comments: 50yo F w/ h/o chronic back pain on methadone who p/w abdominal pain. Pt states that yesterday evening she was sitting coloring and had a gradual onset of RUQ abdominal pain. The pain became more severe and she began having nausea. Today, the pain has been severe and constant and she has had associated vomiting. She takes methadone, last dose was at 4 PM. She also took Reglan and Prilosec without relief. She denies any associated diarrhea or blood in her stool. No fevers, urinary symptoms, cough/cold symptoms, or history of this type of pain. She noted some shortness of breath while in triage which she states is because she is in such severe pain.  Patient is a 50 y.o. female presenting with abdominal pain. The history is provided by the patient.  Abdominal Pain   Past Medical History  Diagnosis Date  . Cysts of both ovaries   . Chronic back pain     had n/v and  fainting after procedure. N/v continues   Past Surgical History  Procedure Laterality Date  . Left oophorectomy      intestine twisted around tube and ovary   . Abdominal hysterectomy    . Bartholin gland cyst excision    . Cervical fusion      plates and screws in neck  . Hernia repair     Family History  Problem Relation Age of Onset  . Diabetes Mother   . Heart disease Mother   . Heart disease Sister   . Heart disease Brother   . Thyroid disease Neg Hx    Social History  Substance Use Topics  . Smoking status: Current Every Day Smoker    Types: Cigarettes  . Smokeless tobacco: Never Used  . Alcohol Use: No   OB History    Gravida Para Term Preterm AB TAB SAB Ectopic Multiple Living   2         2     Review of Systems   Gastrointestinal: Positive for abdominal pain.   10 Systems reviewed and are negative for acute change except as noted in the HPI.    Allergies  Codeine; Corticosteroids; Duloxetine; Nsaids; Sumatriptan succinate; Zolmitriptan; and Citalopram  Home Medications   Prior to Admission medications   Medication Sig Start Date End Date Taking? Authorizing Provider  cyclobenzaprine (FLEXERIL) 10 MG tablet Take 10 mg by mouth 3 (three) times daily as needed for muscle spasms.    Yes Historical Provider, MD  diazepam (VALIUM) 10 MG tablet Take 10 mg by mouth every 6 (six) hours as needed for anxiety.   Yes Historical Provider, MD  methadone (DOLOPHINE) 10 MG tablet Take 10 mg by mouth every 8 (eight) hours.   Yes Historical Provider, MD  metoCLOPramide (REGLAN) 10 MG tablet Take 10 mg by mouth 4 (four) times daily as needed. Nausea and vomitting   Yes Historical Provider, MD  Multiple Vitamin (MULTIVITAMIN) capsule Take 1 capsule by mouth daily.   Yes Historical Provider, MD  omeprazole (PRILOSEC) 20 MG capsule Take 20 mg by mouth every morning.   Yes Historical Provider, MD  ranitidine (ZANTAC) 150 MG tablet Take 150 mg by mouth 2 (two) times daily.   Yes Historical Provider, MD  traZODone (  DESYREL) 50 MG tablet Take 50 mg by mouth at bedtime as needed. Sleep.   Yes Historical Provider, MD  dexamethasone (DECADRON) 1 MG tablet Take 1 tablet the night before labs are drawn Patient not taking: Reported on 09/07/2015 08/18/15   Reather Littler, MD   BP 182/104 mmHg  Pulse 79  Temp(Src) 98.2 F (36.8 C) (Oral)  Resp 16  SpO2 99% Physical Exam  Constitutional: She is oriented to person, place, and time. She appears well-developed and well-nourished. She appears distressed.  Moaning in pain  HENT:  Head: Normocephalic and atraumatic.  Moist mucous membranes  Eyes: Conjunctivae are normal. Pupils are equal, round, and reactive to light.  Neck: Neck supple.  Cardiovascular: Normal rate, regular  rhythm and normal heart sounds.   No murmur heard. Pulmonary/Chest: Effort normal and breath sounds normal. No respiratory distress.  Abdominal: Soft. Bowel sounds are normal. She exhibits no distension. There is no rebound and no guarding.  RUQ TTP  Musculoskeletal: She exhibits no edema.  Neurological: She is alert and oriented to person, place, and time.  Fluent speech  Skin: Skin is warm and dry.  Psychiatric: Judgment normal.  distressed  Nursing note and vitals reviewed.   ED Course  Procedures (including critical care time) Labs Review Labs Reviewed  COMPREHENSIVE METABOLIC PANEL - Abnormal; Notable for the following:    Glucose, Bld 104 (*)    Calcium 8.5 (*)    All other components within normal limits  LIPASE, BLOOD  CBC  URINALYSIS, ROUTINE W REFLEX MICROSCOPIC (NOT AT [facility])    Imaging Review Dg Chest 2 View  09/07/2015  CLINICAL DATA:  Right upper quadrant pain EXAM: CHEST  2 VIEW COMPARISON:  12/11/2013 FINDINGS: There is mild cardiac enlargement. No pleural effusion or edema identified. No airspace consolidation identified. No pleural effusion or edema. There is no focal bone abnormality noted. IMPRESSION: 1. No acute cardiopulmonary abnormalities. 2. Cardiac enlargement. Electronically Signed   By: Signa Kell M.D.   On: 09/07/2015 20:04   US Abdomen Complete  09/07/2015  CLINICAL DATA:  Right upper quadrant pain EXAM: ABDOMEN ULTRASOUND COMPLETE COMPARISON:  12/11/2013 FINDINGS: Gallbladder: Multiple stones are identified which measure up to 11 mm. There is pericholecystic fluid and gallbladder wall thickening measuring up to 5.1 mm. Positive sonographic Murphy's sign. Common bile duct: Diameter: 7.5 mm. Liver: No focal lesion identified. Within normal limits in parenchymal echogenicity. IVC: No abnormality visualized. Pancreas: Visualized portion unremarkable. Spleen: Size and appearance within normal limits. Right Kidney: Length: 11.8 cm. Echogenicity within  normal limits. No mass or hydronephrosis visualized. Left Kidney: Length: 10.9 cm. Echogenicity within normal limits. No mass or hydronephrosis visualized. Abdominal aorta: No aneurysm visualized. Other findings: None. IMPRESSION: 1. Imaging findings compatible with acute cholecystitis. 2. Gallstones. Electronically Signed   By: Signa Kell M.D.   On: 09/07/2015 20:44   I have personally reviewed and evaluated these lab results as part of my medical decision-making.   EKG Interpretation   Date/Time:  Monday September 07 2015 19:15:17 EST Ventricular Rate:  79 PR Interval:  176 QRS Duration: 77 QT Interval:  379 QTC Calculation: 434 R Axis:   31 Text Interpretation:  Sinus rhythm No significant change since last  tracing Confirmed by Jalynne Persico MD, Nadege Carriger [phone]) on 09/07/2015 7:36:48 PM     Medications  promethazine (PHENERGAN) injection 12.5 mg (12.5 mg Intravenous Given 09/07/15 2107)  HYDROmorphone (DILAUDID) injection 0.5 mg (0.5 mg Intravenous Given 09/07/15 2103)  ondansetron (ZOFRAN) injection 4 mg (  4 mg Intravenous Given 09/07/15 2101)    MDM   Final diagnoses:  Acute cholecystitis   PT p/w 1 day of RUQ abdominal pain associated w/ N/V. On exam, she was in moderate distress, moaning and holding her right side. Vital signs notable for hypertension at 181/113. The remainder of her vital signs were unremarkable. She had focal right upper quadrant tenderness without rebound or guarding. No lower abdominal tenderness. Gave the patient Zofran and Dilaudid and obtained above lab work which showed normal lipase and unremarkable CBC/CMP. Because of location of pain, obtained abdominal ultrasound to evaluate for gallbladder pathology. Also obtained chest x-ray given report of shortness of breath.   Chest x-ray unremarkable. Ultrasound shows evidence of acute cholecystitis with a cholecystic fluid and gallbladder wall thickening to 5.1 mm. Pt had ongoing vomiting and gave IV Phenergan. Consulted  general surgery and discussed w/ Dr. Daphine Deutscher. He will admit patient for further management.  Laurence Spates, MD 09/07/15 [phone]

## 2015-09-07 NOTE — ED Notes (Addendum)
Pt c/o RUQ abdominal pain, N/V since last night. Pt denies diarrhea, fevers. Pt denies urinary symptoms. Denies hx of the same pain. A&Ox4 and ambulatory. Pt crying in triage. Pt takes methadone for the pain, last dose was at 4pm. Pt also took reglan and prilosec without relief.

## 2015-09-07 NOTE — H&P (Signed)
Chief Complaint:  Right upper quadrant pain x 2 days  History of Present Illness:  [name] is an 50 y.o. female who is followed by Dr. Kathryne Eriksson.  She has chronic back pain and takes methadone.  She presents with 2-3 days of abdominal pain and an ultrasound tonight shows multiple gallstones and some wall edema and pericholecystic fluid.   She has had prior hiatal hernia repair and "stomach stapling' by Dr. Duke Salvia in [place].    Past Medical History  Diagnosis Date  . Cysts of both ovaries   . Chronic back pain     had n/v and  fainting after procedure. N/v continues    Past Surgical History  Procedure Laterality Date  . Left oophorectomy      intestine twisted around tube and ovary   . Abdominal hysterectomy    . Bartholin gland cyst excision    . Cervical fusion      plates and screws in neck  . Hernia repair      Current Facility-Administered Medications  Medication Dose Route Frequency Provider Last Rate Last Dose  . promethazine (PHENERGAN) injection 12.5 mg  12.5 mg Intravenous Q6H PRN Sharlett Iles, MD   12.5 mg at 09/07/15 2107   Current Outpatient Prescriptions  Medication Sig Dispense Refill  . cyclobenzaprine (FLEXERIL) 10 MG tablet Take 10 mg by mouth 3 (three) times daily as needed for muscle spasms.     . diazepam (VALIUM) 10 MG tablet Take 10 mg by mouth every 6 (six) hours as needed for anxiety.    . methadone (DOLOPHINE) 10 MG tablet Take 10 mg by mouth every 8 (eight) hours.    . metoCLOPramide (REGLAN) 10 MG tablet Take 10 mg by mouth 4 (four) times daily as needed. Nausea and vomitting    . Multiple Vitamin (MULTIVITAMIN) capsule Take 1 capsule by mouth daily.    Marland Kitchen omeprazole (PRILOSEC) 20 MG capsule Take 20 mg by mouth every morning.    . ranitidine (ZANTAC) 150 MG tablet Take 150 mg by mouth 2 (two) times daily.    . traZODone (DESYREL) 50 MG tablet Take 50 mg by mouth at bedtime as needed. Sleep.    Marland Kitchen dexamethasone (DECADRON) 1 MG tablet  Take 1 tablet the night before labs are drawn (Patient not taking: Reported on 09/07/2015) 1 tablet 0   Codeine; Corticosteroids; Duloxetine; Nsaids; Sumatriptan succinate; Zolmitriptan; and Citalopram Family History  Problem Relation Age of Onset  . Diabetes Mother   . Heart disease Mother   . Heart disease Sister   . Heart disease Brother   . Thyroid disease Neg Hx    Social History:   reports that she has been smoking Cigarettes.  She has never used smokeless tobacco. She reports that she does not drink alcohol or use illicit drugs.   REVIEW OF SYSTEMS : Negative except for anxiety and obesity  Physical Exam:   Blood pressure 182/104, pulse 79, temperature 98.2 F (36.8 C), temperature source Oral, resp. rate 16, SpO2 99 %. There is no weight on file to calculate BMI.  Gen:  WDWN WF NAD  Neurological: Alert and oriented to person, place, and time. Motor and sensory function is grossly intact  Head: Normocephalic and atraumatic.  Eyes: Conjunctivae are normal. Pupils are equal, round, and reactive to light. No scleral icterus.  Neck: Normal range of motion. Neck supple. No tracheal deviation or thyromegaly present.  Cardiovascular:  SR without murmurs or gallops.  No carotid bruits  Breast:  Not examined Respiratory: Effort normal.  No respiratory distress. No chest wall tenderness. Breath sounds normal.  No wheezes, rales or rhonchi.  Abdomen:  Multiple tatoos;  Right upper quadrant pain GU:  Not examined Musculoskeletal: Normal range of motion. Extremities are nontender. No cyanosis, edema or clubbing noted Lymphadenopathy: No cervical, preauricular, postauricular or axillary adenopathy is present Skin: Skin is warm and dry. No rash noted. No diaphoresis. No erythema. No pallor. Pscyh: Normal mood and affect. Behavior is normal. Judgment and thought content normal.   LABORATORY RESULTS: Results for orders placed or performed during the hospital encounter of 09/07/15 (from the  past 48 hour(s))  Lipase, blood     Status: None   Collection Time: 09/07/15  5:24 PM  Result Value Ref Range   Lipase 16 11 - 51 U/L  Comprehensive metabolic panel     Status: Abnormal   Collection Time: 09/07/15  5:24 PM  Result Value Ref Range   Sodium 137 135 - 145 mmol/L   Potassium 3.7 3.5 - 5.1 mmol/L   Chloride 101 101 - 111 mmol/L   CO2 27 22 - 32 mmol/L   Glucose, Bld 104 (H) 65 - 99 mg/dL   BUN 8 6 - 20 mg/dL   Creatinine, Ser 0.62 0.44 - 1.00 mg/dL   Calcium 8.5 (L) 8.9 - 10.3 mg/dL   Total Protein 7.3 6.5 - 8.1 g/dL   Albumin 3.9 3.5 - 5.0 g/dL   AST 20 15 - 41 U/L   ALT 17 14 - 54 U/L   Alkaline Phosphatase 86 38 - 126 U/L   Total Bilirubin 0.4 0.3 - 1.2 mg/dL   GFR calc non Af Amer >60 >60 mL/min   GFR calc Af Amer >60 >60 mL/min    Comment: (NOTE) The eGFR has been calculated using the CKD EPI equation. This calculation has not been validated in all clinical situations. eGFR's persistently <60 mL/min signify possible Chronic Kidney Disease.    Anion gap 9 5 - 15  CBC     Status: None   Collection Time: 09/07/15  5:24 PM  Result Value Ref Range   WBC 10.2 4.0 - 10.5 K/uL   RBC 4.38 3.87 - 5.11 MIL/uL   Hemoglobin 13.7 12.0 - 15.0 g/dL   HCT 41.9 36.0 - 46.0 %   MCV 95.7 78.0 - 100.0 fL   MCH 31.3 26.0 - 34.0 pg   MCHC 32.7 30.0 - 36.0 g/dL   RDW 13.2 11.5 - 15.5 %   Platelets 251 150 - 400 K/uL     RADIOLOGY RESULTS: Dg Chest 2 View  09/07/2015  CLINICAL DATA:  Right upper quadrant pain EXAM: CHEST  2 VIEW COMPARISON:  12/11/2013 FINDINGS: There is mild cardiac enlargement. No pleural effusion or edema identified. No airspace consolidation identified. No pleural effusion or edema. There is no focal bone abnormality noted. IMPRESSION: 1. No acute cardiopulmonary abnormalities. 2. Cardiac enlargement. Electronically Signed   By: Kerby Moors M.D.   On: 09/07/2015 20:04   US Abdomen Complete  09/07/2015  CLINICAL DATA:  Right upper quadrant pain EXAM:  ABDOMEN ULTRASOUND COMPLETE COMPARISON:  12/11/2013 FINDINGS: Gallbladder: Multiple stones are identified which measure up to 11 mm. There is pericholecystic fluid and gallbladder wall thickening measuring up to 5.1 mm. Positive sonographic Murphy's sign. Common bile duct: Diameter: 7.5 mm. Liver: No focal lesion identified. Within normal limits in parenchymal echogenicity. IVC: No abnormality visualized. Pancreas: Visualized portion unremarkable. Spleen: Size and appearance within  normal limits. Right Kidney: Length: 11.8 cm. Echogenicity within normal limits. No mass or hydronephrosis visualized. Left Kidney: Length: 10.9 cm. Echogenicity within normal limits. No mass or hydronephrosis visualized. Abdominal aorta: No aneurysm visualized. Other findings: None. IMPRESSION: 1. Imaging findings compatible with acute cholecystitis. 2. Gallstones. Electronically Signed   By: Kerby Moors M.D.   On: 09/07/2015 20:44    Problem List: Patient Active Problem List   Diagnosis Date Noted  . Acute cholecystitis 09/07/2015  . 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 Abnormal weight gain 08/18/2015  . Obesity BMI 31 05/17/2013    Assessment & Plan: Acute cholecystitis Admit; pain control, cholecystectomy    Matt B. Hassell Done, MD, [facility] [phone] beeper [phone]  09/07/2015 10:02 PM

## 2015-09-08 ENCOUNTER — Encounter (HOSPITAL_COMMUNITY)
Admission: EM | Disposition: A | Discharge status: Home / Self Care | Payer: Self-pay | Source: Home / Self Care | Attending: Emergency Medicine

## 2015-09-08 ENCOUNTER — Inpatient Hospital Stay (HOSPITAL_COMMUNITY): Payer: BLUE CROSS/BLUE SHIELD

## 2015-09-08 ENCOUNTER — Inpatient Hospital Stay (HOSPITAL_COMMUNITY): Payer: BLUE CROSS/BLUE SHIELD | Admitting: Anesthesiology

## 2015-09-08 ENCOUNTER — Encounter (HOSPITAL_COMMUNITY): Payer: Self-pay

## 2015-09-08 HISTORY — PX: CHOLECYSTECTOMY: SHX55

## 2015-09-08 LAB — COMPREHENSIVE METABOLIC PANEL
ALBUMIN: 3.7 g/dL (ref 3.5–5.0)
ALK PHOS: 93 U/L (ref 38–126)
ALT: 19 U/L (ref 14–54)
ANION GAP: 10 (ref 5–15)
AST: 24 U/L (ref 15–41)
BUN: 7 mg/dL (ref 6–20)
CALCIUM: 8.6 mg/dL — AB (ref 8.9–10.3)
CO2: 27 mmol/L (ref 22–32)
Chloride: 101 mmol/L (ref 101–111)
Creatinine, Ser: 0.55 mg/dL (ref 0.44–1.00)
GFR calc Af Amer: >60 mL/min (ref >60)
GFR calc non Af Amer: >60 mL/min (ref >60)
GLUCOSE: 128 mg/dL — AB (ref 65–99)
Potassium: 4.3 mmol/L (ref 3.5–5.1)
SODIUM: 138 mmol/L (ref 135–145)
Total Bilirubin: 0.5 mg/dL (ref 0.3–1.2)
Total Protein: 7.3 g/dL (ref 6.5–8.1)

## 2015-09-08 LAB — CBC
HCT: 42.4 % (ref 36.0–46.0)
HEMOGLOBIN: 13.9 g/dL (ref 12.0–15.0)
MCH: 31.1 pg (ref 26.0–34.0)
MCHC: 32.8 g/dL (ref 30.0–36.0)
MCV: 94.9 fL (ref 78.0–100.0)
Platelets: 254 10*3/uL (ref 150–400)
RBC: 4.47 MIL/uL (ref 3.87–5.11)
RDW: 13.2 % (ref 11.5–15.5)
WBC: 14.6 10*3/uL — AB (ref 4.0–10.5)

## 2015-09-08 LAB — SURGICAL PCR SCREEN
MRSA, PCR: NEGATIVE
Staphylococcus aureus: NEGATIVE

## 2015-09-08 SURGERY — LAPAROSCOPIC CHOLECYSTECTOMY WITH INTRAOPERATIVE CHOLANGIOGRAM
Anesthesia: General | Site: Abdomen

## 2015-09-08 MED ORDER — PANTOPRAZOLE SODIUM 40 MG IV SOLR
40.0000 mg | Freq: Every day | INTRAVENOUS | Status: DC
  Administered 2015-09-08 (×2): 40 mg via INTRAVENOUS
  Filled 2015-09-08 (×3): qty 40

## 2015-09-08 MED ORDER — DIAZEPAM 5 MG PO TABS: 10.0000 mg | ORAL_TABLET | Freq: Four times a day (QID) | ORAL | Status: DC | PRN

## 2015-09-08 MED ORDER — HEPARIN SODIUM (PORCINE) 5000 UNIT/ML IJ SOLN
5000.0000 [IU] | Freq: Three times a day (TID) | INTRAMUSCULAR | Status: DC
Start: 2015-09-08 — End: 2015-09-09
  Administered 2015-09-08 – 2015-09-09 (×4): 5000 [IU] via SUBCUTANEOUS
  Filled 2015-09-08 (×8): qty 1

## 2015-09-08 MED ORDER — METHADONE HCL 5 MG PO TABS
10.0000 mg | ORAL_TABLET | Freq: Three times a day (TID) | ORAL | Status: DC
  Administered 2015-09-08 – 2015-09-09 (×4): 10 mg via ORAL
  Filled 2015-09-08 (×4): qty 2

## 2015-09-08 MED ORDER — PROPOFOL 10 MG/ML IV BOLUS
INTRAVENOUS | Status: DC | PRN
  Administered 2015-09-08: 150 mg via INTRAVENOUS

## 2015-09-08 MED ORDER — ONDANSETRON HCL 4 MG/2ML IJ SOLN
INTRAMUSCULAR | Status: DC | PRN
  Administered 2015-09-08 (×2): 4 mg via INTRAVENOUS

## 2015-09-08 MED ORDER — HYDROMORPHONE HCL 1 MG/ML IJ SOLN: 0.2500 mg | INTRAMUSCULAR | Status: DC | PRN

## 2015-09-08 MED ORDER — ROCURONIUM BROMIDE 100 MG/10ML IV SOLN
INTRAVENOUS | Status: AC
  Filled 2015-09-08: qty 1

## 2015-09-08 MED ORDER — METOCLOPRAMIDE HCL 5 MG/ML IJ SOLN
INTRAMUSCULAR | Status: DC | PRN
  Administered 2015-09-08: 10 mg via INTRAVENOUS

## 2015-09-08 MED ORDER — MIDAZOLAM HCL 2 MG/2ML IJ SOLN
INTRAMUSCULAR | Status: AC
  Filled 2015-09-08: qty 2

## 2015-09-08 MED ORDER — ONDANSETRON HCL 4 MG/2ML IJ SOLN
INTRAMUSCULAR | Status: AC
  Filled 2015-09-08: qty 2

## 2015-09-08 MED ORDER — KCL IN DEXTROSE-NACL 20-5-0.45 MEQ/L-%-% IV SOLN
INTRAVENOUS | Status: DC
  Administered 2015-09-08 (×2): via INTRAVENOUS
  Filled 2015-09-08 (×4): qty 1000

## 2015-09-08 MED ORDER — EPHEDRINE SULFATE 50 MG/ML IJ SOLN
INTRAMUSCULAR | Status: AC
  Filled 2015-09-08: qty 1

## 2015-09-08 MED ORDER — PHENYLEPHRINE 40 MCG/ML (10ML) SYRINGE FOR IV PUSH (FOR BLOOD PRESSURE SUPPORT)
PREFILLED_SYRINGE | INTRAVENOUS | Status: AC
  Filled 2015-09-08: qty 20

## 2015-09-08 MED ORDER — FENTANYL CITRATE (PF) 100 MCG/2ML IJ SOLN
INTRAMUSCULAR | Status: DC | PRN
  Administered 2015-09-08: 100 ug via INTRAVENOUS
  Administered 2015-09-08 (×3): 50 ug via INTRAVENOUS

## 2015-09-08 MED ORDER — CETYLPYRIDINIUM CHLORIDE 0.05 % MT LIQD: 7.0000 mL | Freq: Two times a day (BID) | OROMUCOSAL | Status: DC

## 2015-09-08 MED ORDER — SUCCINYLCHOLINE CHLORIDE 20 MG/ML IJ SOLN
INTRAMUSCULAR | Status: DC | PRN
  Administered 2015-09-08: 100 mg via INTRAVENOUS

## 2015-09-08 MED ORDER — FENTANYL CITRATE (PF) 250 MCG/5ML IJ SOLN
INTRAMUSCULAR | Status: AC
Start: 2015-09-08 — End: 2015-09-08
  Filled 2015-09-08: qty 5

## 2015-09-08 MED ORDER — FENTANYL CITRATE (PF) 100 MCG/2ML IJ SOLN: 25.0000 ug | INTRAMUSCULAR | Status: DC | PRN

## 2015-09-08 MED ORDER — PHENYLEPHRINE HCL 10 MG/ML IJ SOLN
INTRAMUSCULAR | Status: DC | PRN
  Administered 2015-09-08 (×2): 80 ug via INTRAVENOUS

## 2015-09-08 MED ORDER — PROPOFOL 10 MG/ML IV BOLUS
INTRAVENOUS | Status: AC
  Filled 2015-09-08: qty 20

## 2015-09-08 MED ORDER — LIDOCAINE HCL (CARDIAC) 20 MG/ML IV SOLN
INTRAVENOUS | Status: AC
  Filled 2015-09-08: qty 5

## 2015-09-08 MED ORDER — IOHEXOL 300 MG/ML  SOLN
INTRAMUSCULAR | Status: DC | PRN
  Administered 2015-09-08: 50 mL via INTRAVENOUS

## 2015-09-08 MED ORDER — BUPIVACAINE-EPINEPHRINE (PF) 0.5% -1:200000 IJ SOLN
INTRAMUSCULAR | Status: AC
  Filled 2015-09-08: qty 30

## 2015-09-08 MED ORDER — OXYCODONE-ACETAMINOPHEN 5-325 MG PO TABS
1.0000 | ORAL_TABLET | ORAL | Status: DC | PRN
  Administered 2015-09-08 (×2): 1 via ORAL
  Administered 2015-09-09: 2 via ORAL
  Administered 2015-09-09: 1 via ORAL
  Filled 2015-09-08 (×3): qty 1
  Filled 2015-09-08: qty 2

## 2015-09-08 MED ORDER — PIPERACILLIN-TAZOBACTAM 3.375 G IVPB
3.3750 g | Freq: Three times a day (TID) | INTRAVENOUS | Status: DC
  Administered 2015-09-08 (×4): 3.375 g via INTRAVENOUS
  Filled 2015-09-08 (×5): qty 50

## 2015-09-08 MED ORDER — LACTATED RINGERS IV SOLN
INTRAVENOUS | Status: DC
  Administered 2015-09-08: 1000 mL via INTRAVENOUS
  Administered 2015-09-08 (×2): via INTRAVENOUS

## 2015-09-08 MED ORDER — MIDAZOLAM HCL 5 MG/5ML IJ SOLN
INTRAMUSCULAR | Status: DC | PRN
  Administered 2015-09-08 (×2): 1 mg via INTRAVENOUS

## 2015-09-08 MED ORDER — BUPIVACAINE-EPINEPHRINE 0.25% -1:200000 IJ SOLN
INTRAMUSCULAR | Status: AC
  Filled 2015-09-08: qty 1

## 2015-09-08 MED ORDER — DEXAMETHASONE SODIUM PHOSPHATE 10 MG/ML IJ SOLN
INTRAMUSCULAR | Status: AC
  Filled 2015-09-08: qty 1

## 2015-09-08 MED ORDER — ATROPINE SULFATE 0.4 MG/ML IJ SOLN
INTRAMUSCULAR | Status: AC
  Filled 2015-09-08: qty 1

## 2015-09-08 MED ORDER — TRAZODONE HCL 50 MG PO TABS: 50.0000 mg | ORAL_TABLET | Freq: Every evening | ORAL | Status: DC | PRN

## 2015-09-08 MED ORDER — LIDOCAINE HCL (CARDIAC) 20 MG/ML IV SOLN
INTRAVENOUS | Status: DC | PRN
  Administered 2015-09-08: 100 mg via INTRAVENOUS

## 2015-09-08 MED ORDER — BUPIVACAINE-EPINEPHRINE 0.25% -1:200000 IJ SOLN
INTRAMUSCULAR | Status: DC | PRN
  Administered 2015-09-08: 35 mL

## 2015-09-08 MED ORDER — SUGAMMADEX SODIUM 200 MG/2ML IV SOLN
INTRAVENOUS | Status: AC
  Filled 2015-09-08: qty 2

## 2015-09-08 MED ORDER — 0.9 % SODIUM CHLORIDE (POUR BTL) OPTIME
TOPICAL | Status: DC | PRN
  Administered 2015-09-08: 1000 mL

## 2015-09-08 MED ORDER — ONDANSETRON HCL 4 MG/2ML IJ SOLN: 4.0000 mg | Freq: Once | INTRAMUSCULAR | Status: DC | PRN

## 2015-09-08 MED ORDER — SUGAMMADEX SODIUM 200 MG/2ML IV SOLN
INTRAVENOUS | Status: DC | PRN
Start: 2015-09-08 — End: 2015-09-08
  Administered 2015-09-08: 200 mg via INTRAVENOUS

## 2015-09-08 MED ORDER — CHLORHEXIDINE GLUCONATE 0.12 % MT SOLN
15.0000 mL | Freq: Two times a day (BID) | OROMUCOSAL | Status: DC
  Administered 2015-09-08 – 2015-09-09 (×2): 15 mL via OROMUCOSAL
  Filled 2015-09-08 (×5): qty 15

## 2015-09-08 MED ORDER — LACTATED RINGERS IR SOLN
Status: DC | PRN
Start: 2015-09-08 — End: 2015-09-08
  Administered 2015-09-08: 1000 mL

## 2015-09-08 MED ORDER — ROCURONIUM BROMIDE 100 MG/10ML IV SOLN
INTRAVENOUS | Status: DC | PRN
  Administered 2015-09-08: 10 mg via INTRAVENOUS
  Administered 2015-09-08: 40 mg via INTRAVENOUS

## 2015-09-08 SURGICAL SUPPLY — 29 items
APPLIER CLIP 5 13 M/L LIGAMAX5 (MISCELLANEOUS) ×3
APR CLP MED LRG 5 ANG JAW (MISCELLANEOUS) ×1
BAG SPEC RTRVL LRG 6X4 10 (ENDOMECHANICALS) ×1
CATH REDDICK CHOLANGI 4FR 50CM (CATHETERS) ×3 IMPLANT
CHLORAPREP W/TINT 26ML (MISCELLANEOUS) ×3 IMPLANT
CLIP APPLIE 5 13 M/L LIGAMAX5 (MISCELLANEOUS) ×1 IMPLANT
COVER MAYO STAND STRL (DRAPES) ×3 IMPLANT
COVER SURGICAL LIGHT HANDLE (MISCELLANEOUS) ×3 IMPLANT
DECANTER SPIKE VIAL GLASS SM (MISCELLANEOUS) ×3 IMPLANT
DRAPE C-ARM 42X120 X-RAY (DRAPES) ×3 IMPLANT
DRAPE LAPAROSCOPIC ABDOMINAL (DRAPES) ×3 IMPLANT
ELECT REM PT RETURN 9FT ADLT (ELECTROSURGICAL) ×3
ELECTRODE REM PT RTRN 9FT ADLT (ELECTROSURGICAL) ×1 IMPLANT
GLOVE BIO SURGEON STRL SZ7.5 (GLOVE) ×3 IMPLANT
GOWN STRL REUS W/TWL XL LVL3 (GOWN DISPOSABLE) ×9 IMPLANT
HEMOSTAT SURGICEL 4X8 (HEMOSTASIS) IMPLANT
IV CATH 14GX2 1/4 (CATHETERS) ×3 IMPLANT
KIT BASIN OR (CUSTOM PROCEDURE TRAY) ×3 IMPLANT
LIQUID BAND (GAUZE/BANDAGES/DRESSINGS) ×3 IMPLANT
POUCH SPECIMEN RETRIEVAL 10MM (ENDOMECHANICALS) ×2 IMPLANT
SET IRRIG TUBING LAPAROSCOPIC (IRRIGATION / IRRIGATOR) ×3 IMPLANT
SLEEVE XCEL OPT CAN 5 100 (ENDOMECHANICALS) ×6 IMPLANT
SUT MNCRL AB 4-0 PS2 18 (SUTURE) ×3 IMPLANT
TOWEL OR 17X26 10 PK STRL BLUE (TOWEL DISPOSABLE) ×3 IMPLANT
TOWEL OR NON WOVEN STRL DISP B (DISPOSABLE) ×3 IMPLANT
TRAY LAPAROSCOPIC (CUSTOM PROCEDURE TRAY) ×3 IMPLANT
TROCAR BLADELESS OPT 5 100 (ENDOMECHANICALS) ×3 IMPLANT
TROCAR XCEL BLUNT TIP 100MML (ENDOMECHANICALS) ×3 IMPLANT
TUBING INSUF HEATED (TUBING) ×2 IMPLANT

## 2015-09-08 NOTE — Transfer of Care (Signed)
Immediate Anesthesia Transfer of Care Note  Patient: [name]  Procedure(s) Performed: Procedure(s): LAPAROSCOPIC CHOLECYSTECTOMY WITH INTRAOPERATIVE CHOLANGIOGRAM (N/A)  Patient Location: PACU  Anesthesia Type:General  Level of Consciousness:  sedated, patient cooperative and responds to stimulation  Airway & Oxygen Therapy:Patient Spontanous Breathing and Patient connected to face mask oxgen  Post-op Assessment:  Report given to PACU RN and Post -op Vital signs reviewed and stable  Post vital signs:  Reviewed and stable  Last Vitals:  Filed Vitals:   09/08/15 0946 09/08/15 1358  BP: 137/81   Pulse: 102   Temp: 37 C 37.3 C  Resp: 16     Complications: No apparent anesthesia complications

## 2015-09-08 NOTE — Anesthesia Preprocedure Evaluation (Signed)
Anesthesia Evaluation  Patient identified by MRN, date of birth, ID band Patient awake    Reviewed: Allergy & Precautions, NPO status , Patient's Chart, lab work & pertinent test results  History of Anesthesia Complications Negative for: history of anesthetic complications  Airway Mallampati: II  TM Distance: >3 FB Neck ROM: Full    Dental no notable dental hx. (+) Dental Advisory Given   Pulmonary Current Smoker,    Pulmonary exam normal breath sounds clear to auscultation       Cardiovascular negative cardio ROS Normal cardiovascular exam Rhythm:Regular Rate:Normal     Neuro/Psych negative neurological ROS  negative psych ROS   GI/Hepatic negative GI ROS, Neg liver ROS,   Endo/Other  Hypothyroidism   Renal/GU negative Renal ROS  negative genitourinary   Musculoskeletal negative musculoskeletal ROS (+)   Abdominal   Peds negative pediatric ROS (+)  Hematology negative hematology ROS (+)   Anesthesia Other Findings   Reproductive/Obstetrics negative OB ROS                             Anesthesia Physical Anesthesia Plan  ASA: II  Anesthesia Plan: General   Post-op Pain Management:    Induction: Intravenous  Airway Management Planned: Oral ETT  Additional Equipment:   Intra-op Plan:   Post-operative Plan: Extubation in OR  Informed Consent: I have reviewed the patients History and Physical, chart, labs and discussed the procedure including the risks, benefits and alternatives for the proposed anesthesia with the patient or authorized representative who has indicated his/her understanding and acceptance.   Dental advisory given  Plan Discussed with: CRNA  Anesthesia Plan Comments:         Anesthesia Quick Evaluation

## 2015-09-08 NOTE — Anesthesia Procedure Notes (Signed)
Procedure Name: Intubation Date/Time: 09/08/2015 12:29 PM Performed by: Caren Garske, Nuala Alpha Pre-anesthesia Checklist: Patient identified, Emergency Drugs available, Suction available, Patient being monitored and Timeout performed Patient Re-evaluated:Patient Re-evaluated prior to inductionOxygen Delivery Method: Circle system utilized Preoxygenation: Pre-oxygenation with 100% oxygen Intubation Type: IV induction Ventilation: Mask ventilation without difficulty Laryngoscope Size: Mac and 4 Grade View: Grade I Tube type: Oral Tube size: 7.5 mm Number of attempts: 1 Airway Equipment and Method: Stylet Placement Confirmation: ETT inserted through vocal cords under direct vision,  positive ETCO2,  CO2 detector and breath sounds checked- equal and bilateral Secured at: 22 cm Tube secured with: Tape Dental Injury: Teeth and Oropharynx as per pre-operative assessment

## 2015-09-08 NOTE — Op Note (Signed)
09/07/2015 - 09/08/2015  1:47 PM  PATIENT:  [name]  50 y.o. female  PRE-OPERATIVE DIAGNOSIS: cholecystitis with cholelithiasis  POST-OPERATIVE DIAGNOSIS:  Cholecystitis with cholelithiasis  PROCEDURE:  Procedure(s): LAPAROSCOPIC CHOLECYSTECTOMY WITH INTRAOPERATIVE CHOLANGIOGRAM (N/A)  SURGEON:  Surgeon(s) and Role:    * Griselda Miner, MD - Primary  PHYSICIAN ASSISTANT:   ASSISTANTS: none   ANESTHESIA:   general  EBL:  Total I/O In: 1050 [I.V.:1000; IV Piggyback:50] Out: -   BLOOD ADMINISTERED:none  DRAINS: none   LOCAL MEDICATIONS USED:  MARCAINE     SPECIMEN:  Source of Specimen:  gallbladder  DISPOSITION OF SPECIMEN:  PATHOLOGY  COUNTS:  YES  TOURNIQUET:  * No tourniquets in log *  DICTATION: .Dragon Dictation   Procedure: After informed consent was obtained the patient was brought to the operating room and placed in the supine position on the operating room table. After adequate induction of general anesthesia the patient's abdomen was prepped with ChloraPrep allowed to dry and draped in usual sterile manner. The area below the umbilicus was infiltrated with quarter percent  Marcaine. A small incision was made with a 15 blade knife. The incision was carried down through the subcutaneous tissue bluntly with a hemostat and [employer] retractors. The linea alba was identified. The linea alba was incised with a 15 blade knife and each side was grasped with Coker clamps. The preperitoneal space was then probed with a hemostat until the peritoneum was opened and access was gained to the abdominal cavity. A 0 Vicryl pursestring stitch was placed in the fascia surrounding the opening. A Hassan cannula was then placed through the opening and anchored in place with the previously placed Vicryl purse string stitch. The abdomen was insufflated with carbon dioxide without difficulty. A laparoscope was inserted through the [facility] cannula in the right upper quadrant was inspected.  Next the epigastric region was infiltrated with % Marcaine. A small incision was made with a 15 blade knife. A 5 mm port was placed bluntly through this incision into the abdominal cavity under direct vision. Next 2 sites were chosen laterally on the right side of the abdomen for placement of 5 mm ports. Each of these areas was infiltrated with quarter percent Marcaine. Small stab incisions were made with a 15 blade knife. 5 mm ports were then placed bluntly through these incisions into the abdominal cavity under direct vision without difficulty. The gallbladder was very inflamed and edematous and had to be aspirated with a Nijat aspirator.  A blunt grasper was placed through the lateralmost 5 mm port and used to grasp the dome of the gallbladder and elevated anteriorly and superiorly. Another blunt grasper was placed through the other 5 mm port and used to retract the body and neck of the gallbladder. A dissector was placed through the epigastric port and using the electrocautery the peritoneal reflection at the gallbladder neck was opened. Blunt dissection was then carried out in this area until the gallbladder neck-cystic duct junction was readily identified and a good window was created. A single clip was placed on the gallbladder neck. A small  ductotomy was made just below the clip with laparoscopic scissors. A 14-gauge Angiocath was then placed through the anterior abdominal wall under direct vision. A Reddick cholangiogram catheter was then placed through the Angiocath and flushed. The catheter was then placed in the cystic duct and anchored in place with a clip. A cholangiogram was obtained that showed no filling defects good emptying into the duodenum  an adequate length on the cystic duct. The anchoring clip and catheters were then removed from the patient. 3 clips were placed proximally on the cystic duct and the duct was divided between the 2 sets of clips. Posterior to this the cystic artery was  identified and again dissected bluntly in a circumferential manner until a good window  was created. 2 clips were placed proximally and one distally on the artery and the artery was divided between the 2 sets of clips. Next a laparoscopic hook cautery device was used to separate the gallbladder from the liver bed. Prior to completely detaching the gallbladder from the liver bed the liver bed was inspected and several small bleeding points were coagulated with the electrocautery until the area was completely hemostatic. The gallbladder was then detached the rest of it from the liver bed without difficulty. A laparoscopic bag was inserted through the hassan port. The laparoscope was moved to the epigastric port. The gallbladder was placed within the bag and the bag was sealed.  The bag with the gallbladder was then removed with the [facility] cannula through the infraumbilical port without difficulty. The fascial defect was then closed with the previously placed Vicryl pursestring stitch as well as with another figure-of-eight 0 Vicryl stitch. The liver bed was inspected again and found to be hemostatic. The abdomen was irrigated with copious amounts of saline until the effluent was clear. The ports were then removed under direct vision without difficulty and were found to be hemostatic. The gas was allowed to escape. The skin incisions were all closed with interrupted 4-0 Monocryl subcuticular stitches. Dermabond dressings were applied. The patient tolerated the procedure well. At the end of the case all needle sponge and instrument counts were correct. The patient was then awakened and taken to recovery in stable condition  PLAN OF CARE: Admit to inpatient   PATIENT DISPOSITION:  PACU - hemodynamically stable.   Delay start of Pharmacological VTE agent (>24hrs) due to surgical blood loss or risk of bleeding: no

## 2015-09-08 NOTE — Progress Notes (Signed)
  Subjective: Complains of upper abdominal pain  Objective: Vital signs in last 24 hours: Temp:  [97.5 F (36.4 C)-98.9 F (37.2 C)] 97.5 F (36.4 C) (01/31 0639) Pulse Rate:  [79-96] 92 (01/31 0639) Resp:  [16-18] 16 (01/31 0639) BP: (123-182)/(86-113) 123/86 mmHg (01/31 0639) SpO2:  [93 %-99 %] 93 % (01/31 0639) Weight:  [89.8 kg (197 lb 15.6 oz)] 89.8 kg (197 lb 15.6 oz) (01/31 0022) Last BM Date: 09/05/15 (normal for pt)  Intake/Output from previous day: 01/30 0701 - 01/31 0700 In: 176.7 [I.V.:126.7; IV Piggyback:50] Out: -  Intake/Output this shift: Total I/O In: 50 [IV Piggyback:50] Out: -   Resp: clear to auscultation bilaterally Cardio: regular rate and rhythm GI: soft, moderate tenderness RUQ  Lab Results:   Recent Labs  09/07/15 1724 09/08/15 0513  WBC 10.2 14.6*  HGB 13.7 13.9  HCT 41.9 42.4  PLT 251 254   BMET  Recent Labs  09/07/15 1724 09/08/15 0513  NA 137 138  K 3.7 4.3  CL 101 101  CO2 27 27  GLUCOSE 104* 128*  BUN 8 7  CREATININE 0.62 0.55  CALCIUM 8.5* 8.6*   PT/INR No results for input(s): LABPROT, INR in the last 72 hours. ABG No results for input(s): PHART, HCO3 in the last 72 hours.  Invalid input(s): PCO2, PO2  Studies/Results: Dg Chest 2 View  09/07/2015  CLINICAL DATA:  Right upper quadrant pain EXAM: CHEST  2 VIEW COMPARISON:  12/11/2013 FINDINGS: There is mild cardiac enlargement. No pleural effusion or edema identified. No airspace consolidation identified. No pleural effusion or edema. There is no focal bone abnormality noted. IMPRESSION: 1. No acute cardiopulmonary abnormalities. 2. Cardiac enlargement. Electronically Signed   By: Signa Kell M.D.   On: 09/07/2015 20:04   US Abdomen Complete  09/07/2015  CLINICAL DATA:  Right upper quadrant pain EXAM: ABDOMEN ULTRASOUND COMPLETE COMPARISON:  12/11/2013 FINDINGS: Gallbladder: Multiple stones are identified which measure up to 11 mm. There is pericholecystic fluid  and gallbladder wall thickening measuring up to 5.1 mm. Positive sonographic Murphy's sign. Common bile duct: Diameter: 7.5 mm. Liver: No focal lesion identified. Within normal limits in parenchymal echogenicity. IVC: No abnormality visualized. Pancreas: Visualized portion unremarkable. Spleen: Size and appearance within normal limits. Right Kidney: Length: 11.8 cm. Echogenicity within normal limits. No mass or hydronephrosis visualized. Left Kidney: Length: 10.9 cm. Echogenicity within normal limits. No mass or hydronephrosis visualized. Abdominal aorta: No aneurysm visualized. Other findings: None. IMPRESSION: 1. Imaging findings compatible with acute cholecystitis. 2. Gallstones. Electronically Signed   By: Signa Kell M.D.   On: 09/07/2015 20:44    Anti-infectives: Anti-infectives    Start     Dose/Rate Route Frequency Ordered Stop   09/08/15 0030  piperacillin-tazobactam (ZOSYN) IVPB 3.375 g     3.375 g 12.5 mL/hr over 240 Minutes Intravenous Every 8 hours 09/08/15 0018        Assessment/Plan: s/p Procedure(s): LAPAROSCOPIC CHOLECYSTECTOMY WITH INTRAOPERATIVE CHOLANGIOGRAM (N/A) Plan for lap chole today. Risks and benefits of surgery as well as some of the technical aspects discussed with the patient including the risk of common duct injury and possible open surgery and she understands and wishes to proceed  LOS: 1 day    TOTH III,Anyela Napierkowski S 09/08/2015

## 2015-09-08 NOTE — H&P (View-Only) (Signed)
  Subjective: Complains of upper abdominal pain  Objective: Vital signs in last 24 hours: Temp:  [97.5 F (36.4 C)-98.9 F (37.2 C)] 97.5 F (36.4 C) (01/31 0639) Pulse Rate:  [79-96] 92 (01/31 0639) Resp:  [16-18] 16 (01/31 0639) BP: (123-182)/(86-113) 123/86 mmHg (01/31 0639) SpO2:  [93 %-99 %] 93 % (01/31 0639) Weight:  [89.8 kg (197 lb 15.6 oz)] 89.8 kg (197 lb 15.6 oz) (01/31 0022) Last BM Date: 09/05/15 (normal for pt)  Intake/Output from previous day: 01/30 0701 - 01/31 0700 In: 176.7 [I.V.:126.7; IV Piggyback:50] Out: -  Intake/Output this shift: Total I/O In: 50 [IV Piggyback:50] Out: -   Resp: clear to auscultation bilaterally Cardio: regular rate and rhythm GI: soft, moderate tenderness RUQ  Lab Results:   Recent Labs  09/07/15 1724 09/08/15 0513  WBC 10.2 14.6*  HGB 13.7 13.9  HCT 41.9 42.4  PLT 251 254   BMET  Recent Labs  09/07/15 1724 09/08/15 0513  NA 137 138  K 3.7 4.3  CL 101 101  CO2 27 27  GLUCOSE 104* 128*  BUN 8 7  CREATININE 0.62 0.55  CALCIUM 8.5* 8.6*   PT/INR No results for input(s): LABPROT, INR in the last 72 hours. ABG No results for input(s): PHART, HCO3 in the last 72 hours.  Invalid input(s): PCO2, PO2  Studies/Results: Dg Chest 2 View  09/07/2015  CLINICAL DATA:  Right upper quadrant pain EXAM: CHEST  2 VIEW COMPARISON:  12/11/2013 FINDINGS: There is mild cardiac enlargement. No pleural effusion or edema identified. No airspace consolidation identified. No pleural effusion or edema. There is no focal bone abnormality noted. IMPRESSION: 1. No acute cardiopulmonary abnormalities. 2. Cardiac enlargement. Electronically Signed   By: Taylor  Stroud M.D.   On: 09/07/2015 20:04   Us Abdomen Complete  09/07/2015  CLINICAL DATA:  Right upper quadrant pain EXAM: ABDOMEN ULTRASOUND COMPLETE COMPARISON:  12/11/2013 FINDINGS: Gallbladder: Multiple stones are identified which measure up to 11 mm. There is pericholecystic fluid  and gallbladder wall thickening measuring up to 5.1 mm. Positive sonographic Murphy's sign. Common bile duct: Diameter: 7.5 mm. Liver: No focal lesion identified. Within normal limits in parenchymal echogenicity. IVC: No abnormality visualized. Pancreas: Visualized portion unremarkable. Spleen: Size and appearance within normal limits. Right Kidney: Length: 11.8 cm. Echogenicity within normal limits. No mass or hydronephrosis visualized. Left Kidney: Length: 10.9 cm. Echogenicity within normal limits. No mass or hydronephrosis visualized. Abdominal aorta: No aneurysm visualized. Other findings: None. IMPRESSION: 1. Imaging findings compatible with acute cholecystitis. 2. Gallstones. Electronically Signed   By: Taylor  Stroud M.D.   On: 09/07/2015 20:44    Anti-infectives: Anti-infectives    Start     Dose/Rate Route Frequency Ordered Stop   09/08/15 0030  piperacillin-tazobactam (ZOSYN) IVPB 3.375 g     3.375 g 12.5 mL/hr over 240 Minutes Intravenous Every 8 hours 09/08/15 0018        Assessment/Plan: s/p Procedure(s): LAPAROSCOPIC CHOLECYSTECTOMY WITH INTRAOPERATIVE CHOLANGIOGRAM (N/A) Plan for lap chole today. Risks and benefits of surgery as well as some of the technical aspects discussed with the patient including the risk of common duct injury and possible open surgery and she understands and wishes to proceed  LOS: 1 day    TOTH III,PAUL S 09/08/2015  

## 2015-09-08 NOTE — Anesthesia Postprocedure Evaluation (Signed)
Anesthesia Post Note  Patient: DAISEY CALOCA  Procedure(s) Performed: Procedure(s) (LRB): LAPAROSCOPIC CHOLECYSTECTOMY WITH INTRAOPERATIVE CHOLANGIOGRAM (N/A)  Patient location during evaluation: PACU Anesthesia Type: General Level of consciousness: awake and alert Pain management: pain level controlled Vital Signs Assessment: post-procedure vital signs reviewed and stable Respiratory status: spontaneous breathing, nonlabored ventilation, respiratory function stable and patient connected to nasal cannula oxygen Cardiovascular status: blood pressure returned to baseline and stable Postop Assessment: no signs of nausea or vomiting Anesthetic complications: no    Last Vitals:  Filed Vitals:   09/08/15 1430 09/08/15 1449  BP: 118/78 120/74  Pulse: 111 105  Temp: 37.1 C 37 C  Resp: 12 12    Last Pain:  Filed Vitals:   09/08/15 1451  PainSc: 0-No pain                 Daquawn Seelman JENNETTE

## 2015-09-08 NOTE — Interval H&P Note (Signed)
History and Physical Interval Note:  09/08/2015 12:02 PM  [name]  has presented today for surgery, with the diagnosis of gallstones   The various methods of treatment have been discussed with the patient and family. After consideration of risks, benefits and other options for treatment, the patient has consented to  Procedure(s): LAPAROSCOPIC CHOLECYSTECTOMY WITH INTRAOPERATIVE CHOLANGIOGRAM (N/A) as a surgical intervention .  The patient's history has been reviewed, patient examined, no change in status, stable for surgery.  I have reviewed the patient's chart and labs.  Questions were answered to the patient's satisfaction.     TOTH III,PAUL S

## 2015-09-09 MED ORDER — CYCLOBENZAPRINE HCL 10 MG PO TABS: 10.0000 mg | ORAL_TABLET | Freq: Three times a day (TID) | ORAL | Status: DC | PRN

## 2015-09-09 MED ORDER — OXYCODONE-ACETAMINOPHEN 5-325 MG PO TABS: 1.0000 | ORAL_TABLET | Freq: Four times a day (QID) | ORAL | Status: DC | PRN

## 2015-09-09 MED ORDER — FAMOTIDINE 20 MG PO TABS
20.0000 mg | ORAL_TABLET | Freq: Two times a day (BID) | ORAL | Status: DC | PRN
  Administered 2015-09-09: 20 mg via ORAL
  Filled 2015-09-09 (×2): qty 1

## 2015-09-09 MED ORDER — PANTOPRAZOLE SODIUM 40 MG PO TBEC
40.0000 mg | DELAYED_RELEASE_TABLET | Freq: Every day | ORAL | Status: DC
  Administered 2015-09-09: 40 mg via ORAL

## 2015-09-09 NOTE — Discharge Instructions (Signed)
Your appointment is at 11:40am, please arrive at least before your appointment to complete your check in paperwork.  If you are unable to arrive prior to your appointment time we may have to cancel or reschedule you.  LAPAROSCOPIC SURGERY: POST OP INSTRUCTIONS  1. DIET: Follow a light bland diet the first 24 hours after arrival home, such as soup, liquids, crackers, etc. Be sure to include lots of fluids daily. Avoid fast food or heavy meals as your are more likely to get nauseated. Eat a low fat the next few days after surgery.  2. Take your usually prescribed home medications unless otherwise directed. 3. PAIN CONTROL:  1. Pain is best controlled by a usual combination of three different methods TOGETHER:  1. Ice/Heat 2. Over the counter pain medication 3. Prescription pain medication 2. Most patients will experience some swelling and bruising around the incisions. Ice packs or heating pads (30-60 minutes up to 6 times a day) will help. Use ice for the first few days to help decrease swelling and bruising, then switch to heat to help relax tight/sore spots and speed recovery. Some people prefer to use ice alone, heat alone, alternating between ice & heat. Experiment to what works for you. Swelling and bruising can take several weeks to resolve.  3. It is helpful to take an over-the-counter pain medication regularly for the first few weeks. Choose one of the following that works best for you:  1. Naproxen (Aleve, etc) Two  tabs twice a day 2. Ibuprofen (Advil, etc) Three  tabs four times a day (every meal & bedtime) 3. Acetaminophen (Tylenol, etc) 500-650mg  four times a day (every meal & bedtime) 4. A prescription for pain medication (such as oxycodone, hydrocodone, etc) should be given to you upon discharge. Take your pain medication as prescribed.  1. If you are having problems/concerns with the prescription medicine (does not control pain, nausea, vomiting, rash, itching,  etc), please call us [phone] to see if we need to switch you to a different pain medicine that will work better for you and/or control your side effect better. 2. If you need a refill on your pain medication, please contact your pharmacy. They will contact our office to request authorization. Prescriptions will not be filled after 5 pm or on week-ends. 4. Avoid getting constipated. Between the surgery and the pain medications, it is common to experience some constipation. Increasing fluid intake and taking a fiber supplement (such as Metamucil, Citrucel, FiberCon, MiraLax, etc) 1-2 times a day regularly will usually help prevent this problem from occurring. A mild laxative (prune juice, Milk of Magnesia, MiraLax, etc) should be taken according to package directions if there are no bowel movements after 48 hours.  5. Watch out for diarrhea. If you have many loose bowel movements, simplify your diet to bland foods & liquids for a few days. Stop any stool softeners and decrease your fiber supplement. Switching to mild anti-diarrheal medications (Kayopectate, Pepto Bismol) can help. If this worsens or does not improve, please call us. 6. Wash / shower every day. You may shower over the dressings as they are waterproof. Continue to shower over incision(s) after the dressing is off. 7. Remove your waterproof bandages 5 days after surgery. You may leave the incision open to air. You may replace a dressing/Band-Aid to cover the incision for comfort if you wish.  8. ACTIVITIES as tolerated:  1. You may resume regular (light) daily activities beginning the next day--such as daily self-care,  walking, climbing stairs--gradually increasing activities as tolerated. If you can walk 30 minutes without difficulty, it is safe to try more intense activity such as jogging, treadmill, bicycling, low-impact aerobics, swimming, etc. °2. Save the most intensive and strenuous activity for last such as sit-ups, heavy lifting,  contact sports, etc Refrain from any heavy lifting or straining until you are off narcotics for pain control.  °3. DO NOT PUSH THROUGH PAIN. Let pain be your guide: If it hurts to do something, don't do it. Pain is your body warning you to avoid that activity for another week until the pain goes down. °4. You may drive when you are no longer taking prescription pain medication, you can comfortably wear a seatbelt, and you can safely maneuver your car and apply brakes. °5. You may have sexual intercourse when it is comfortable.  °9. FOLLOW UP in our office  °1. Please call [facility] at [phone] to set up an appointment to see your surgeon in the office for a follow-up appointment approximately 2-3 weeks after your surgery. °2. Make sure that you call for this appointment the day you arrive home to insure a convenient appointment time. °     10. IF YOU HAVE DISABILITY OR FAMILY LEAVE FORMS, BRING THEM TO THE               OFFICE FOR PROCESSING.  ° °WHEN TO CALL US [phone]:  °1. Poor pain control °2. Reactions / problems with new medications (rash/itching, nausea, etc)  °3. Fever over 101.5 F (38.5 C) °4. Inability to urinate °5. Nausea and/or vomiting °6. Worsening swelling or bruising °7. Continued bleeding from incision. °8. Increased pain, redness, or drainage from the incision ° °The clinic staff is available to answer your questions during regular business hours (8:30am-5pm). Please don’t hesitate to call and ask to speak to one of our nurses for clinical concerns.  °If you have a medical emergency, go to the nearest emergency room or call 911.  °A surgeon from [facility] is always on call at the hospitals  ° °[facility]  °[address], [address], [place], [place] [address] ?  °MAIN: [phone] ? TOLL FREE: [phone] ?  °FAX [phone]  °[email or web address] ° °

## 2015-09-09 NOTE — Discharge Summary (Signed)
[facility] Discharge Summary   Patient ID: [name] MRN: [id number] DOB/AGE: 1965/09/21 50 y.o.  Admit date: 09/07/2015 Discharge date: 09/09/2015  Admitting Diagnosis: Acute cholecystitis  Discharge Diagnosis Patient Active Problem List   Diagnosis Date Noted  . Acute cholecystitis 09/07/2015  . 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 Abnormal weight gain 08/18/2015  . Obesity BMI 31 05/17/2013    Consultants None  Imaging: Dg Chest 2 View  09/07/2015  CLINICAL DATA:  Right upper quadrant pain EXAM: CHEST  2 VIEW COMPARISON:  12/11/2013 FINDINGS: There is mild cardiac enlargement. No pleural effusion or edema identified. No airspace consolidation identified. No pleural effusion or edema. There is no focal bone abnormality noted. IMPRESSION: 1. No acute cardiopulmonary abnormalities. 2. Cardiac enlargement. Electronically Signed   By: Signa Kell M.D.   On: 09/07/2015 20:04   Dg Cholangiogram Operative  09/08/2015  CLINICAL DATA:  50 year old female with a history of acute cholecystitis EXAM: INTRAOPERATIVE CHOLANGIOGRAM TECHNIQUE: Cholangiographic images from the C-arm fluoroscopic device were submitted for interpretation post-operatively. Please see the procedural report for the amount of contrast and the fluoroscopy time utilized. COMPARISON:  Ultrasound 09/07/2015 FINDINGS: Surgical instruments project over the upper abdomen. There is cannulation of the cystic duct/gallbladder neck, with antegrade infusion of contrast. Caliber of the extrahepatic ductal system within normal limits. No large filling defect identified. Free flow of contrast across the ampulla. IMPRESSION: Intraoperative cholangiogram demonstrates extrahepatic biliary ducts of unremarkable caliber, with no large filling defect identified. Free flow of contrast across the ampulla. Please refer to the dictated operative report for full details of intraoperative findings and procedure Signed, Yvone Neu.  Loreta Ave, DO [facility] Electronically Signed   By: Gilmer Mor D.O.   On: 09/08/2015 13:40   US Abdomen Complete  09/07/2015  CLINICAL DATA:  Right upper quadrant pain EXAM: ABDOMEN ULTRASOUND COMPLETE COMPARISON:  12/11/2013 FINDINGS: Gallbladder: Multiple stones are identified which measure up to 11 mm. There is pericholecystic fluid and gallbladder wall thickening measuring up to 5.1 mm. Positive sonographic Murphy's sign. Common bile duct: Diameter: 7.5 mm. Liver: No focal lesion identified. Within normal limits in parenchymal echogenicity. IVC: No abnormality visualized. Pancreas: Visualized portion unremarkable. Spleen: Size and appearance within normal limits. Right Kidney: Length: 11.8 cm. Echogenicity within normal limits. No mass or hydronephrosis visualized. Left Kidney: Length: 10.9 cm. Echogenicity within normal limits. No mass or hydronephrosis visualized. Abdominal aorta: No aneurysm visualized. Other findings: None. IMPRESSION: 1. Imaging findings compatible with acute cholecystitis. 2. Gallstones. Electronically Signed   By: Signa Kell M.D.   On: 09/07/2015 20:44    Procedures Dr. Carolynne Edouard (09/08/15) - Laparoscopic Cholecystectomy with NEG [facility]  Hospital Course:  50 y.o. female who is followed by Dr. Benedetto Goad. She has chronic back pain and takes methadone. She presents with 2-3 days of RUQ abdominal pain and an ultrasound tonight shows multiple gallstones and some wall edema and pericholecystic fluid.  She has had prior hiatal hernia repair and "stomach stapling' by Dr. Alva Garnet in [place].   Workup showed acute cholecystitis.  Patient was admitted and underwent procedure listed above.  Tolerated procedure well and was transferred to the floor.  Diet was advanced as tolerated.  On POD #1, the patient was voiding well, tolerating diet, ambulating well, pain well controlled, vital signs stable, incisions c/d/i and felt stable  for discharge home.  Patient will follow up in our office in 3 weeks and knows to call with questions or concerns.  Physical Exam: General:  Alert, NAD, pleasant, comfortable Abd:  Soft, ND, mild tenderness, incisions C/D/I    Medication List    STOP taking these medications        dexamethasone 1 MG tablet  Commonly known as:  DECADRON      TAKE these medications        cyclobenzaprine 10 MG tablet  Commonly known as:  FLEXERIL  Take 10 mg by mouth 3 (three) times daily as needed for muscle spasms.     diazepam 10 MG tablet  Commonly known as:  VALIUM  Take 10 mg by mouth every 6 (six) hours as needed for anxiety.     methadone 10 MG tablet  Commonly known as:  DOLOPHINE  Take 10 mg by mouth every 8 (eight) hours.     metoCLOPramide 10 MG tablet  Commonly known as:  REGLAN  Take 10 mg by mouth 4 (four) times daily as needed. Nausea and vomitting     multivitamin capsule  Take 1 capsule by mouth daily.     omeprazole 20 MG capsule  Commonly known as:  PRILOSEC  Take 20 mg by mouth every morning.     oxyCODONE-acetaminophen 5-325 MG tablet  Commonly known as:  PERCOCET/ROXICET  Take 1-2 tablets by mouth every 6 (six) hours as needed for moderate pain.     ranitidine 150 MG tablet  Commonly known as:  ZANTAC  Take 150 mg by mouth 2 (two) times daily.     traZODone 50 MG tablet  Commonly known as:  DESYREL  Take 50 mg by mouth at bedtime as needed. Sleep.             Follow-up Information    Follow up with [facility] Go on 09/30/2015.   Specialty:  General Surgery   Why:  For post-operation check. Your appointment is at 11:40am, please arrive at least before your appointment to complete your check in paperwork.  If you are unable to arrive prior to your appointment time we may have to cancel or reschedule you.   Contact information:   [address] [place] [address] [phone]       Signed: Bradd Canary [facility] [phone]  09/09/2015, 9:04 AM

## 2015-09-11 ENCOUNTER — Other Ambulatory Visit: Payer: BLUE CROSS/BLUE SHIELD

## 2015-09-16 ENCOUNTER — Ambulatory Visit: Payer: BLUE CROSS/BLUE SHIELD | Admitting: Endocrinology

## 2015-10-01 ENCOUNTER — Other Ambulatory Visit: Payer: BLUE CROSS/BLUE SHIELD

## 2015-10-09 ENCOUNTER — Other Ambulatory Visit: Payer: BLUE CROSS/BLUE SHIELD

## 2015-10-13 ENCOUNTER — Telehealth: Payer: Self-pay | Admitting: Endocrinology

## 2015-10-13 ENCOUNTER — Other Ambulatory Visit: Payer: BLUE CROSS/BLUE SHIELD

## 2015-10-13 ENCOUNTER — Other Ambulatory Visit: Payer: Self-pay | Admitting: *Deleted

## 2015-10-13 MED ORDER — DEXAMETHASONE 1 MG PO TABS: ORAL_TABLET | ORAL | Status: DC

## 2015-10-13 NOTE — Telephone Encounter (Signed)
Dexamethasone 1 mg.  She takes this the night before the lab work which will be cortisol level at 8 AM

## 2015-10-13 NOTE — Telephone Encounter (Signed)
Do you know what medication she is talking about?

## 2015-10-13 NOTE — Telephone Encounter (Signed)
PT said she needs the medication she is supposed to take before her lab appt called in again to [facility] because she had to reschedule her lab appointment to Friday

## 2015-10-13 NOTE — Telephone Encounter (Signed)
rx was sent, message left for patient making her aware.

## 2015-10-16 ENCOUNTER — Other Ambulatory Visit: Payer: BLUE CROSS/BLUE SHIELD

## 2015-10-19 ENCOUNTER — Ambulatory Visit: Payer: BLUE CROSS/BLUE SHIELD | Admitting: Endocrinology

## 2015-10-24 ENCOUNTER — Other Ambulatory Visit: Payer: Self-pay | Admitting: Endocrinology

## 2015-10-29 ENCOUNTER — Other Ambulatory Visit: Payer: Self-pay | Admitting: *Deleted

## 2015-10-29 ENCOUNTER — Telehealth: Payer: Self-pay | Admitting: Endocrinology

## 2015-10-29 MED ORDER — DEXAMETHASONE 1 MG PO TABS: ORAL_TABLET | ORAL | Status: DC

## 2015-10-29 NOTE — Telephone Encounter (Signed)
rx sent for the 3rd time, if she can't do the test on Tuesday, it won't be called in again.

## 2015-10-29 NOTE — Telephone Encounter (Signed)
Pt said she needs a pill called into her pharmacy so she can take it the day before her labs next tuesday

## 2015-10-31 DIAGNOSIS — M79672 Pain in left foot: Secondary | ICD-10-CM | POA: Insufficient documentation

## 2015-10-31 DIAGNOSIS — M549 Dorsalgia, unspecified: Secondary | ICD-10-CM | POA: Insufficient documentation

## 2015-10-31 DIAGNOSIS — M545 Low back pain, unspecified: Secondary | ICD-10-CM | POA: Insufficient documentation

## 2015-10-31 DIAGNOSIS — M25512 Pain in left shoulder: Secondary | ICD-10-CM | POA: Insufficient documentation

## 2015-10-31 DIAGNOSIS — M7918 Myalgia, other site: Secondary | ICD-10-CM | POA: Insufficient documentation

## 2015-11-03 ENCOUNTER — Other Ambulatory Visit: Payer: BLUE CROSS/BLUE SHIELD

## 2015-11-06 ENCOUNTER — Other Ambulatory Visit: Payer: Self-pay

## 2015-11-06 ENCOUNTER — Other Ambulatory Visit: Payer: BLUE CROSS/BLUE SHIELD

## 2015-11-06 ENCOUNTER — Other Ambulatory Visit: Payer: Self-pay | Admitting: *Deleted

## 2015-11-06 DIAGNOSIS — E038 Other specified hypothyroidism: Secondary | ICD-10-CM

## 2015-11-06 DIAGNOSIS — R635 Abnormal weight gain: Secondary | ICD-10-CM

## 2015-11-06 DIAGNOSIS — R5382 Chronic fatigue, unspecified: Secondary | ICD-10-CM

## 2015-11-06 DIAGNOSIS — G9332 Myalgic encephalomyelitis/chronic fatigue syndrome: Secondary | ICD-10-CM

## 2015-11-06 MED ORDER — DEXAMETHASONE 1 MG PO TABS: ORAL_TABLET | ORAL | Status: DC

## 2015-11-09 ENCOUNTER — Other Ambulatory Visit (INDEPENDENT_AMBULATORY_CARE_PROVIDER_SITE_OTHER): Payer: BLUE CROSS/BLUE SHIELD

## 2015-11-09 ENCOUNTER — Ambulatory Visit: Payer: BLUE CROSS/BLUE SHIELD | Admitting: Endocrinology

## 2015-11-09 DIAGNOSIS — E038 Other specified hypothyroidism: Secondary | ICD-10-CM

## 2015-11-09 DIAGNOSIS — R635 Abnormal weight gain: Secondary | ICD-10-CM

## 2015-11-09 DIAGNOSIS — R5382 Chronic fatigue, unspecified: Secondary | ICD-10-CM | POA: Diagnosis not present

## 2015-11-09 DIAGNOSIS — G9332 Myalgic encephalomyelitis/chronic fatigue syndrome: Secondary | ICD-10-CM

## 2015-11-09 LAB — LIPID PANEL
CHOL/HDL RATIO: 7
Cholesterol: 211 mg/dL — ABNORMAL HIGH (ref 0–200)
HDL: 32.3 mg/dL — AB (ref >39.00)
LDL Cholesterol: 146 mg/dL — ABNORMAL HIGH (ref 0–99)
NonHDL: 178.77
TRIGLYCERIDES: 164 mg/dL — AB (ref 0.0–149.0)
VLDL: 32.8 mg/dL (ref 0.0–40.0)

## 2015-11-09 LAB — COMPREHENSIVE METABOLIC PANEL
ALK PHOS: 111 U/L (ref 39–117)
ALT: 35 U/L (ref 0–35)
AST: 26 U/L (ref 0–37)
Albumin: 3.8 g/dL (ref 3.5–5.2)
BUN: 13 mg/dL (ref 6–23)
CO2: 30 mEq/L (ref 19–32)
Calcium: 9 mg/dL (ref 8.4–10.5)
Chloride: 101 mEq/L (ref 96–112)
Creatinine, Ser: 0.68 mg/dL (ref 0.40–1.20)
GFR: 97.97 mL/min (ref >60.00)
Glucose, Bld: 150 mg/dL — ABNORMAL HIGH (ref 70–99)
POTASSIUM: 4.5 meq/L (ref 3.5–5.1)
SODIUM: 137 meq/L (ref 135–145)
TOTAL PROTEIN: 7.2 g/dL (ref 6.0–8.3)
Total Bilirubin: 0.2 mg/dL (ref 0.2–1.2)

## 2015-11-09 LAB — VITAMIN B12: VITAMIN B 12: 1140 pg/mL — AB (ref 211–911)

## 2015-11-09 LAB — TSH: TSH: 2.36 u[IU]/mL (ref 0.35–4.50)

## 2015-11-09 LAB — CORTISOL
Cortisol, Plasma: 0.4 ug/dL
Cortisol, Plasma: 15.5 ug/dL

## 2015-11-09 LAB — T4, FREE: Free T4: 0.99 ng/dL (ref 0.60–1.60)

## 2015-11-09 MED ORDER — COSYNTROPIN 0.25 MG IJ SOLR
0.5000 mg | Freq: Once | INTRAMUSCULAR | Status: AC
  Administered 2015-11-09: 0.5 mg via INTRAMUSCULAR

## 2015-11-09 NOTE — Addendum Note (Signed)
Addended by: Adline MangoSTONE-ELMORE, Denee Boeder I on: 11/09/2015 09:16 AM   Modules accepted: Orders

## 2015-11-10 LAB — INSULIN-LIKE GROWTH FACTOR: Insulin-Like GF-1: 120 ng/mL (ref 57–195)

## 2015-11-11 ENCOUNTER — Ambulatory Visit: Payer: BLUE CROSS/BLUE SHIELD | Admitting: Endocrinology

## 2015-11-12 ENCOUNTER — Encounter: Payer: Self-pay | Admitting: Endocrinology

## 2015-11-12 ENCOUNTER — Ambulatory Visit (INDEPENDENT_AMBULATORY_CARE_PROVIDER_SITE_OTHER): Payer: BLUE CROSS/BLUE SHIELD | Admitting: Endocrinology

## 2015-11-12 VITALS — BP 126/82 | HR 91 | Temp 98.0°F | Resp 16 | Ht 62.0 in | Wt 203.6 lb

## 2015-11-12 DIAGNOSIS — R5382 Chronic fatigue, unspecified: Secondary | ICD-10-CM
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
## 2015-11-13 ENCOUNTER — Ambulatory Visit
Admission: RE | Admit: 2015-11-13 | Discharge: 2015-11-13 | Disposition: A | Discharge status: Home / Self Care | Payer: BLUE CROSS/BLUE SHIELD | Source: Ambulatory Visit | Attending: Obstetrics and Gynecology | Admitting: Obstetrics and Gynecology

## 2015-11-13 ENCOUNTER — Other Ambulatory Visit: Payer: Self-pay | Admitting: Obstetrics and Gynecology

## 2015-11-13 DIAGNOSIS — N644 Mastodynia: Secondary | ICD-10-CM

## 2016-06-12 IMAGING — US US ABDOMEN COMPLETE
1 series · 14 of 25 positions shown · non-contrast
Comparison: 12/11/2013

CLINICAL DATA: Right upper quadrant pain

EXAM:
ABDOMEN ULTRASOUND COMPLETE

[Series 1: us abdomen complete · 0.26mm/px · 14 of 67 slices shown]
[im 1/67]
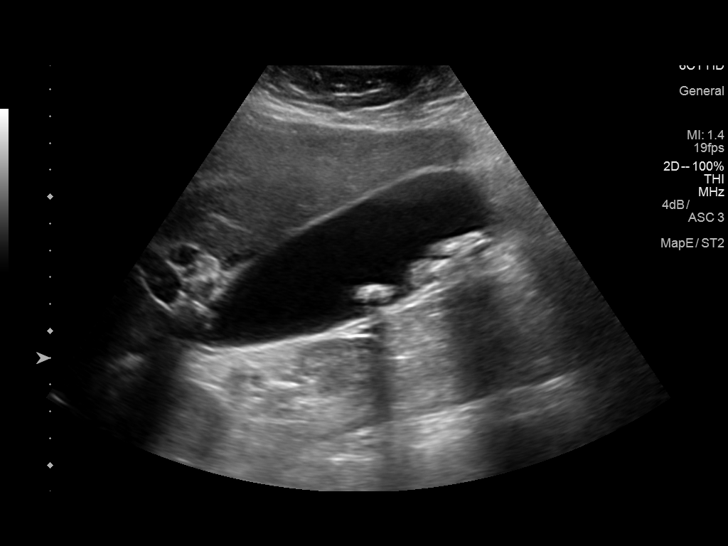
[im 6/67]
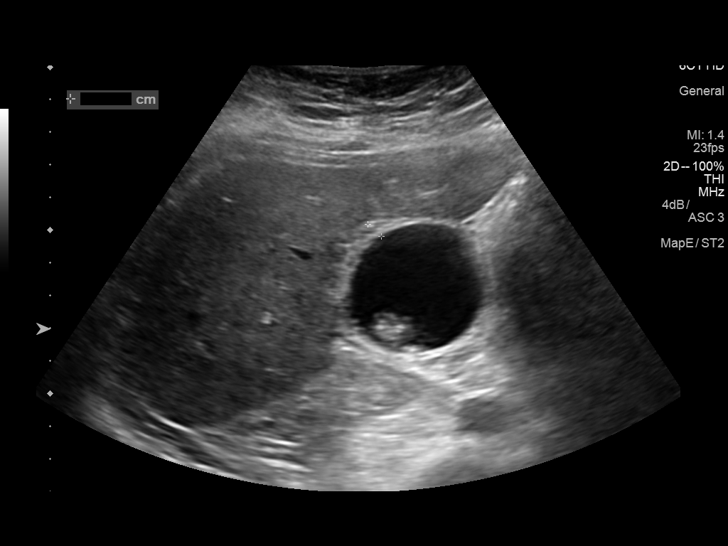
[im 12/67]
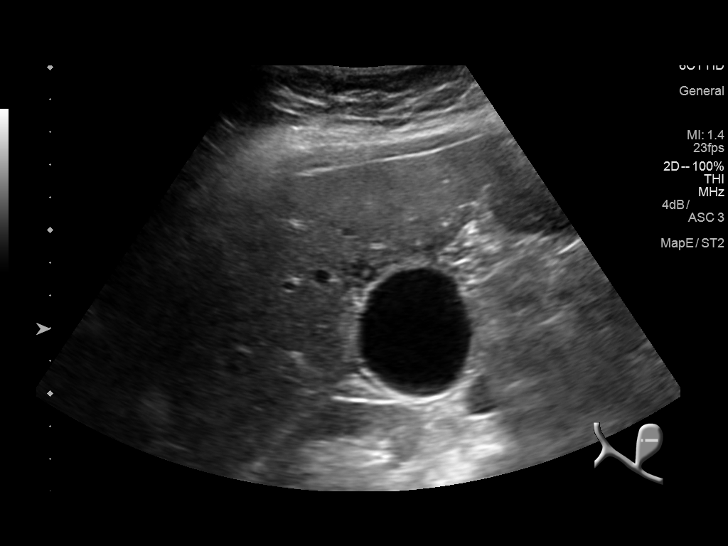
[im 17/67]
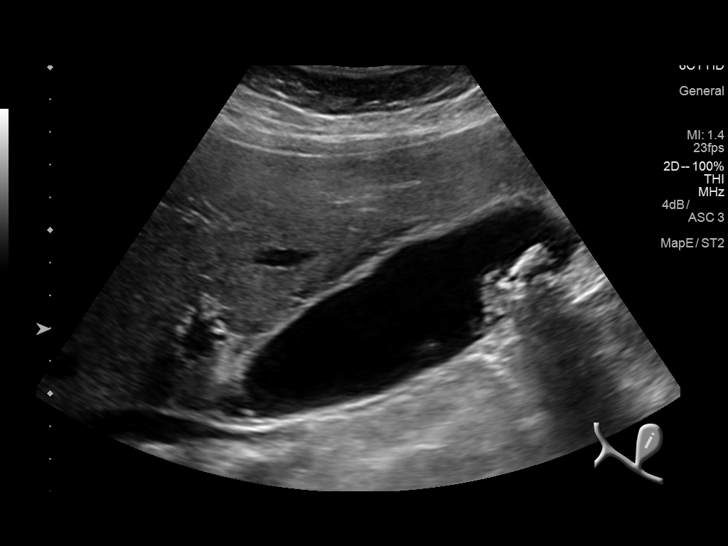
[im 23/67]
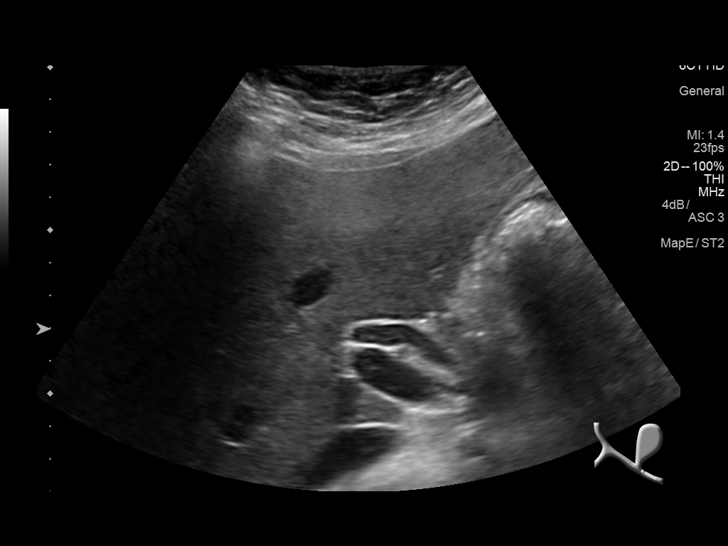
[im 25/67]
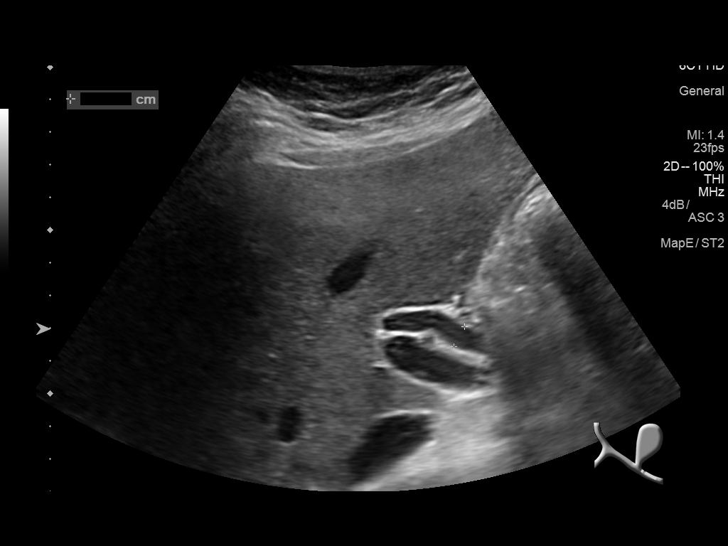
[im 31/67]
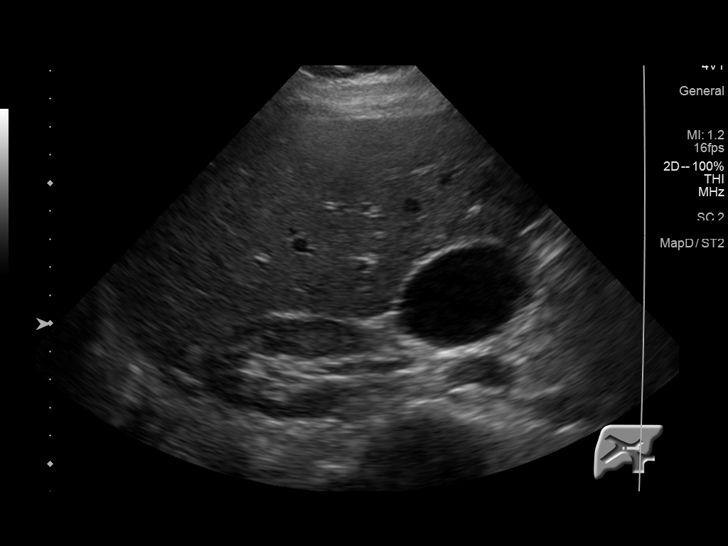
[im 36/67]
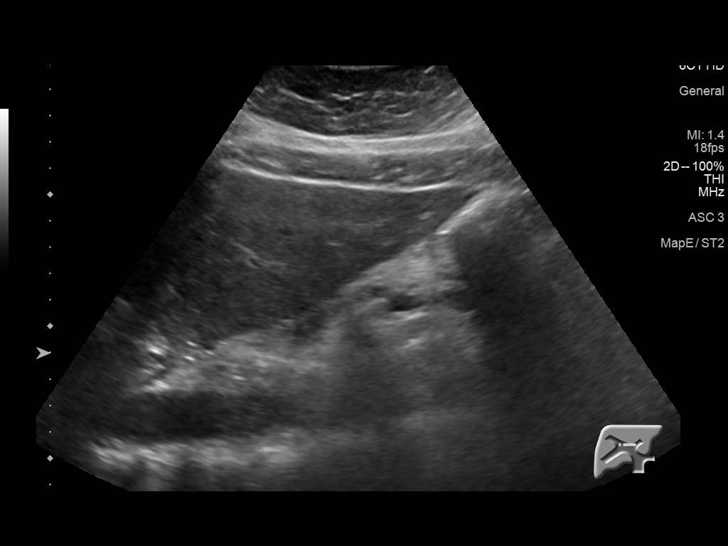
[im 42/67]
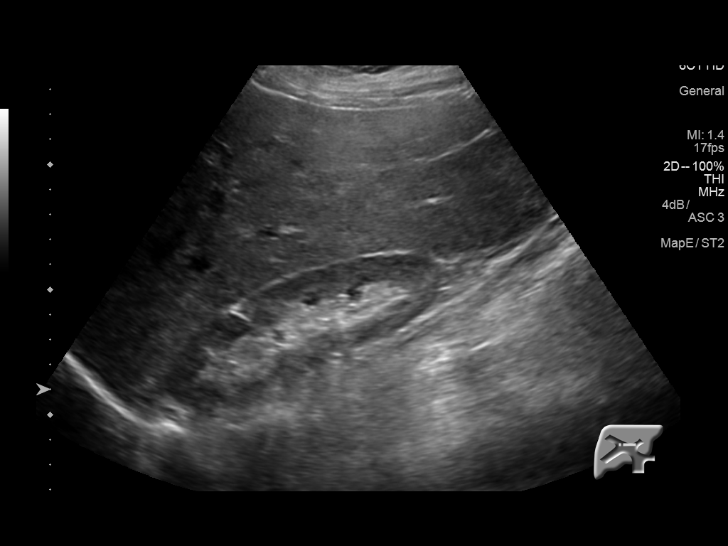
[im 45/67]
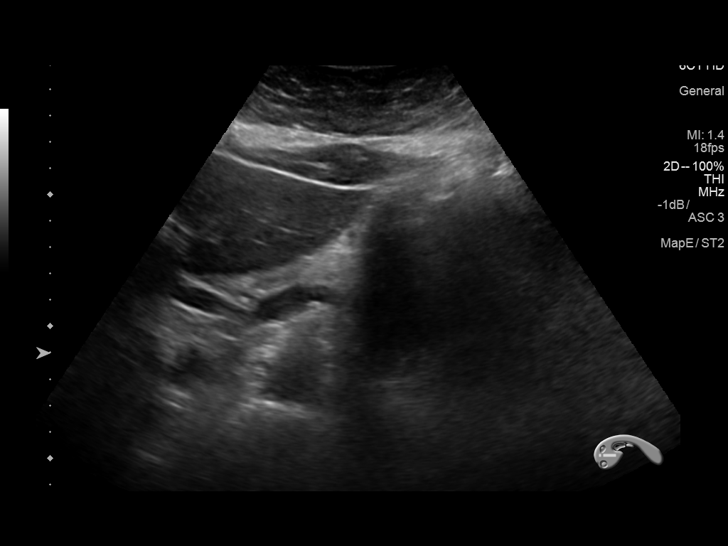
[im 50/67]
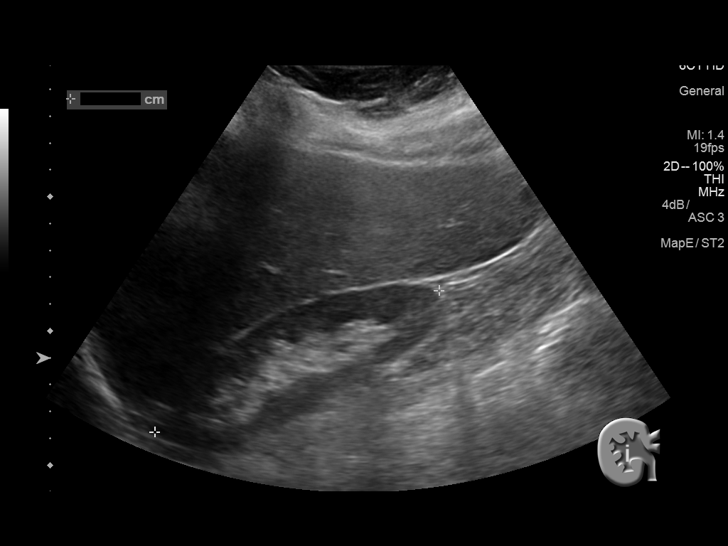
[im 56/67]
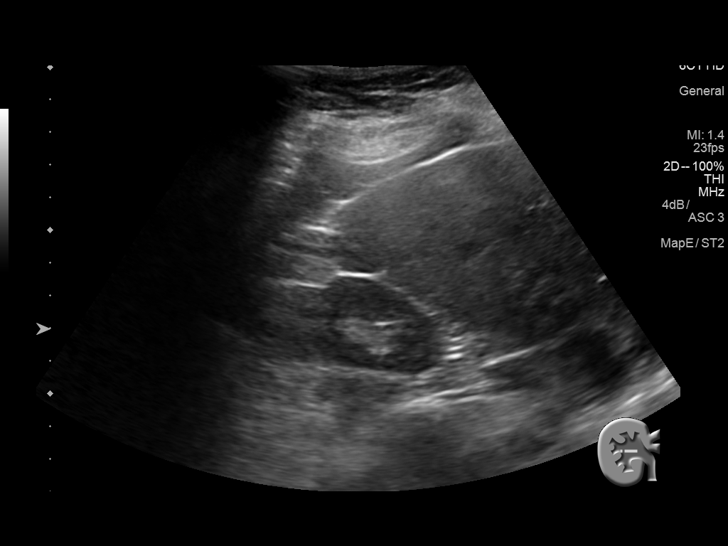
[im 61/67]
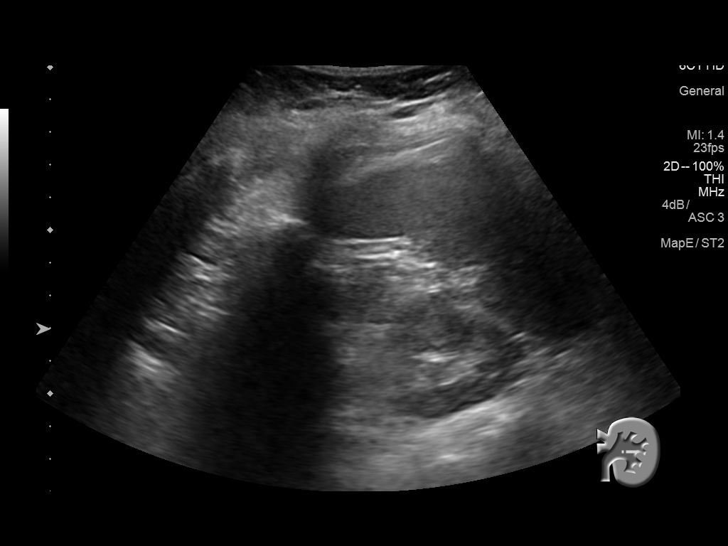
[im 67/67]
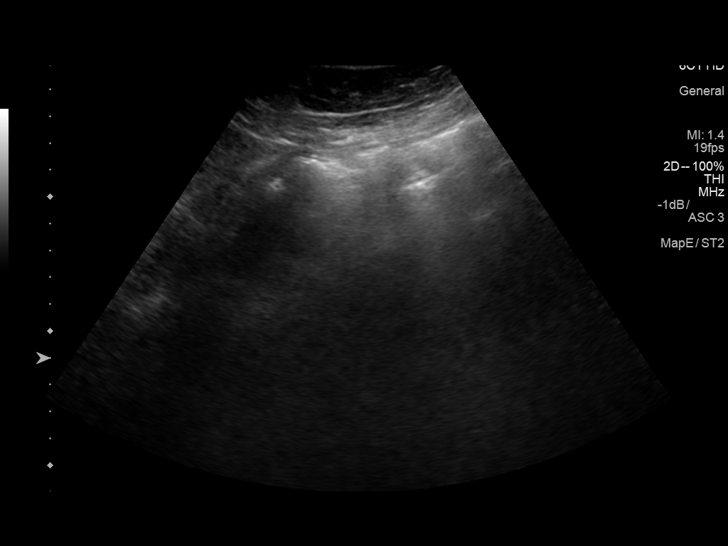

[14 of 25 positions shown; findings below may reference images not displayed]

FINDINGS: Gallbladder: Multiple stones are identified which measure up to 11
mm. There is pericholecystic fluid and gallbladder wall thickening
measuring up to 5.1 mm. Positive sonographic Murphy's sign.

Common bile duct: Diameter: 7.5 mm.

Liver: No focal lesion identified. Within normal limits in
parenchymal echogenicity.

IVC: No abnormality visualized.

Pancreas: Visualized portion unremarkable.

Spleen: Size and appearance within normal limits.

Right Kidney: Length: 11.8 cm. Echogenicity within normal limits. No
mass or hydronephrosis visualized.

Left Kidney: Length: 10.9 cm. Echogenicity within normal limits. No
mass or hydronephrosis visualized.

Abdominal aorta: No aneurysm visualized.

Other findings: None.
IMPRESSION: 1. Imaging findings compatible with acute cholecystitis.
2. Gallstones.

## 2016-11-18 ENCOUNTER — Telehealth: Payer: Self-pay | Admitting: Endocrinology

## 2016-11-18 NOTE — Telephone Encounter (Signed)
We had discussed that she does not have any thyroid problem and does not need a follow-up, not think she needs to be coming for her labs as scheduled either.  She can request notes from her PCP to see what they want

## 2016-11-18 NOTE — Telephone Encounter (Signed)
Left detailed message on her vm and requested a call back if she had any questions 

## 2016-11-18 NOTE — Telephone Encounter (Signed)
Pt went to [facility] on March 4 due to flu like symptoms, they request that she be seen by [place] but she is unaware of why. Please advise

## 2016-12-08 ENCOUNTER — Other Ambulatory Visit: Payer: BLUE CROSS/BLUE SHIELD

## 2017-01-13 ENCOUNTER — Other Ambulatory Visit: Payer: BLUE CROSS/BLUE SHIELD

## 2017-01-17 ENCOUNTER — Ambulatory Visit: Payer: BLUE CROSS/BLUE SHIELD | Admitting: Endocrinology

## 2017-02-10 ENCOUNTER — Other Ambulatory Visit: Payer: Self-pay | Admitting: Family Medicine

## 2017-02-10 DIAGNOSIS — R635 Abnormal weight gain: Secondary | ICD-10-CM

## 2017-02-10 DIAGNOSIS — R188 Other ascites: Secondary | ICD-10-CM

## 2017-02-10 DIAGNOSIS — R102 Pelvic and perineal pain: Secondary | ICD-10-CM

## 2017-02-22 ENCOUNTER — Ambulatory Visit
Admission: RE | Admit: 2017-02-22 | Discharge: 2017-02-22 | Disposition: A | Discharge status: Home / Self Care | Payer: BLUE CROSS/BLUE SHIELD | Source: Ambulatory Visit | Attending: Family Medicine | Admitting: Family Medicine

## 2017-02-22 DIAGNOSIS — R635 Abnormal weight gain: Secondary | ICD-10-CM

## 2017-02-22 DIAGNOSIS — R188 Other ascites: Secondary | ICD-10-CM

## 2017-02-22 DIAGNOSIS — R102 Pelvic and perineal pain: Secondary | ICD-10-CM

## 2017-04-11 ENCOUNTER — Encounter: Payer: Self-pay | Admitting: Gastroenterology

## 2017-04-11 ENCOUNTER — Ambulatory Visit: Payer: BLUE CROSS/BLUE SHIELD | Admitting: Gastroenterology

## 2017-05-17 ENCOUNTER — Institutional Professional Consult (permissible substitution): Payer: BLUE CROSS/BLUE SHIELD | Admitting: Pulmonary Disease

## 2017-06-20 ENCOUNTER — Emergency Department (HOSPITAL_COMMUNITY)
Admission: EM | Admit: 2017-06-20 | Discharge: 2017-06-20 | Discharge status: Against Medical Advice | Payer: BLUE CROSS/BLUE SHIELD

## 2017-06-21 ENCOUNTER — Telehealth: Payer: Self-pay | Admitting: Gastroenterology

## 2017-06-21 NOTE — Telephone Encounter (Signed)
Patient called and was in pain due to ulcers she stated. She said her pain was lasting up to nine hours. I advised her to go to the ED. If she needed an appt after that she will call to set it up.

## 2017-06-22 ENCOUNTER — Other Ambulatory Visit: Payer: Self-pay | Admitting: Gastroenterology

## 2017-06-22 DIAGNOSIS — R1033 Periumbilical pain: Secondary | ICD-10-CM

## 2017-06-27 ENCOUNTER — Other Ambulatory Visit: Payer: BLUE CROSS/BLUE SHIELD

## 2017-07-06 ENCOUNTER — Ambulatory Visit
Admission: RE | Admit: 2017-07-06 | Discharge: 2017-07-06 | Disposition: A | Discharge status: Home / Self Care | Payer: BLUE CROSS/BLUE SHIELD | Source: Ambulatory Visit | Attending: Gastroenterology | Admitting: Gastroenterology

## 2017-07-06 ENCOUNTER — Other Ambulatory Visit: Payer: BLUE CROSS/BLUE SHIELD

## 2017-07-06 DIAGNOSIS — R1033 Periumbilical pain: Secondary | ICD-10-CM

## 2017-07-06 MED ORDER — IOPAMIDOL (ISOVUE-300) INJECTION 61%
125.0000 mL | Freq: Once | INTRAVENOUS | Status: AC | PRN
  Administered 2017-07-06: 125 mL via INTRAVENOUS

## 2017-07-25 ENCOUNTER — Ambulatory Visit: Payer: Self-pay | Admitting: General Surgery
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
## 2017-08-07 ENCOUNTER — Encounter (HOSPITAL_COMMUNITY): Payer: Self-pay

## 2017-08-07 NOTE — Pre-Procedure Instructions (Addendum)
[name]  08/07/2017      [facility] [place] - [address] [place] [address] Phone: [phone] Fax: [phone]    Your procedure is scheduled on August 11, 2017.  [facility] Admitting at 12:45 P.M.  Call this number if you have problems the morning of surgery:  [phone]   Remember:  Do not eat food or drink liquids after midnight.   Take these medicines the morning of surgery with A SIP OF WATER :  Methadone Omeprazole (Prilosec) Metoclopramide (Reglan) if needed Diazepam (Valium) if needed  7 days prior to surgery STOP taking any Aspirin(unless otherwise instructed by your surgeon), Aleve, Naproxen, Ibuprofen, Motrin, Advil, Goody's, BC's, all herbal medications, fish oil, and all vitamins.   Do not wear jewelry, make-up or nail polish.  Do not wear lotions, powders, or perfumes, or deoderant.  Do not shave 48 hours prior to surgery.    Do not bring valuables to the hospital.   [facility] is not responsible for any belongings or valuables.  Contacts, dentures or bridgework may not be worn into surgery.  Leave your suitcase in the car.  After surgery it may be brought to your room.  For patients admitted to the hospital, discharge time will be determined by your treatment team.  Patients discharged the day of surgery will not be allowed to drive home.   Special instructions:   [facility]- Preparing For Surgery  Before surgery, you can play an important role. Because skin is not sterile, your skin needs to be as free of germs as possible. You can reduce the number of germs on your skin by washing with CHG (chlorahexidine gluconate) Soap before surgery.  CHG is an antiseptic cleaner which kills germs and bonds with the skin to continue killing germs even after washing.  Please do not use if you have an allergy to CHG or antibacterial soaps. If your skin becomes reddened/irritated  stop using the CHG.  Do not shave (including legs and underarms) for at least 48 hours prior to first CHG shower. It is OK to shave your face.  Please follow these instructions carefully.   1. Shower the NIGHT BEFORE SURGERY and the MORNING OF SURGERY with CHG.   2. If you chose to wash your hair, wash your hair first as usual with your normal shampoo.  3. After you shampoo, rinse your hair and body thoroughly to remove the shampoo.  4. Use CHG as you would any other liquid soap. You can apply CHG directly to the skin and wash gently with a scrungie or a clean washcloth.   5. Apply the CHG Soap to your body ONLY FROM THE NECK DOWN.  Do not use on open wounds or open sores. Avoid contact with your eyes, ears, mouth and genitals (private parts). Wash Face and genitals (private parts)  with your normal soap.  6. Wash thoroughly, paying special attention to the area where your surgery will be performed.  7. Thoroughly rinse your body with warm water from the neck down.  8. DO NOT shower/wash with your normal soap after using and rinsing off the CHG Soap.  9. Pat yourself dry with a CLEAN TOWEL.  10. Wear CLEAN PAJAMAS to bed the night before surgery, wear comfortable clothes the morning of surgery  11. Place CLEAN SHEETS on your bed the night of your first shower and DO NOT SLEEP WITH  PETS.    Day of Surgery: Do not apply any deodorants/lotions. Please wear clean clothes to the hospital/surgery center.      Please read over the following fact sheets that you were given. Coughing and Deep Breathing and Surgical Site Infection Prevention

## 2017-08-09 ENCOUNTER — Encounter (HOSPITAL_COMMUNITY)
Admission: RE | Admit: 2017-08-09 | Discharge: 2017-08-09 | Disposition: A | Discharge status: Home / Self Care | Payer: BLUE CROSS/BLUE SHIELD | Source: Ambulatory Visit | Attending: General Surgery | Admitting: General Surgery

## 2017-08-09 ENCOUNTER — Encounter (HOSPITAL_COMMUNITY): Payer: Self-pay

## 2017-08-09 DIAGNOSIS — K219 Gastro-esophageal reflux disease without esophagitis: Secondary | ICD-10-CM | POA: Diagnosis not present

## 2017-08-09 DIAGNOSIS — Z79891 Long term (current) use of opiate analgesic: Secondary | ICD-10-CM | POA: Diagnosis not present

## 2017-08-09 DIAGNOSIS — K4030 Unilateral inguinal hernia, with obstruction, without gangrene, not specified as recurrent: Secondary | ICD-10-CM | POA: Diagnosis present

## 2017-08-09 DIAGNOSIS — K430 Incisional hernia with obstruction, without gangrene: Secondary | ICD-10-CM | POA: Diagnosis not present

## 2017-08-09 DIAGNOSIS — Z6836 Body mass index (BMI) 36.0-36.9, adult: Secondary | ICD-10-CM | POA: Diagnosis not present

## 2017-08-09 DIAGNOSIS — Z87891 Personal history of nicotine dependence: Secondary | ICD-10-CM | POA: Diagnosis not present

## 2017-08-09 DIAGNOSIS — M1990 Unspecified osteoarthritis, unspecified site: Secondary | ICD-10-CM | POA: Diagnosis not present

## 2017-08-09 DIAGNOSIS — E039 Hypothyroidism, unspecified: Secondary | ICD-10-CM | POA: Diagnosis not present

## 2017-08-09 DIAGNOSIS — Z79899 Other long term (current) drug therapy: Secondary | ICD-10-CM | POA: Diagnosis not present

## 2017-08-09 DIAGNOSIS — I10 Essential (primary) hypertension: Secondary | ICD-10-CM | POA: Diagnosis not present

## 2017-08-09 DIAGNOSIS — F329 Major depressive disorder, single episode, unspecified: Secondary | ICD-10-CM | POA: Diagnosis not present

## 2017-08-09 DIAGNOSIS — F419 Anxiety disorder, unspecified: Secondary | ICD-10-CM | POA: Diagnosis not present

## 2017-08-09 DIAGNOSIS — K439 Ventral hernia without obstruction or gangrene: Secondary | ICD-10-CM | POA: Insufficient documentation

## 2017-08-09 DIAGNOSIS — E669 Obesity, unspecified: Secondary | ICD-10-CM | POA: Diagnosis not present

## 2017-08-09 DIAGNOSIS — G8921 Chronic pain due to trauma: Secondary | ICD-10-CM | POA: Diagnosis not present

## 2017-08-09 HISTORY — DX: Anxiety disorder, unspecified: F41.9

## 2017-08-09 HISTORY — DX: Unspecified osteoarthritis, unspecified site: M19.90

## 2017-08-09 HISTORY — DX: Nausea with vomiting, unspecified: R11.2

## 2017-08-09 HISTORY — DX: Gastro-esophageal reflux disease without esophagitis: K21.9

## 2017-08-09 HISTORY — DX: Other specified postprocedural states: Z98.890

## 2017-08-09 HISTORY — DX: Major depressive disorder, single episode, unspecified: F32.9

## 2017-08-09 HISTORY — DX: Other chronic pain: G89.29

## 2017-08-09 HISTORY — DX: Depression, unspecified: F32.A

## 2017-08-09 LAB — BASIC METABOLIC PANEL
Anion gap: 7 (ref 5–15)
BUN: 5 mg/dL — AB (ref 6–20)
CALCIUM: 8.4 mg/dL — AB (ref 8.9–10.3)
CO2: 26 mmol/L (ref 22–32)
CREATININE: 0.65 mg/dL (ref 0.44–1.00)
Chloride: 103 mmol/L (ref 101–111)
GFR calc Af Amer: >60 mL/min (ref >60)
GFR calc non Af Amer: >60 mL/min (ref >60)
GLUCOSE: 112 mg/dL — AB (ref 65–99)
Potassium: 4.2 mmol/L (ref 3.5–5.1)
Sodium: 136 mmol/L (ref 135–145)

## 2017-08-09 LAB — CBC
HEMATOCRIT: 41.3 % (ref 36.0–46.0)
Hemoglobin: 13.4 g/dL (ref 12.0–15.0)
MCH: 32 pg (ref 26.0–34.0)
MCHC: 32.4 g/dL (ref 30.0–36.0)
MCV: 98.6 fL (ref 78.0–100.0)
Platelets: 267 10*3/uL (ref 150–400)
RBC: 4.19 MIL/uL (ref 3.87–5.11)
RDW: 14.2 % (ref 11.5–15.5)
WBC: 10.4 10*3/uL (ref 4.0–10.5)

## 2017-08-09 MED ORDER — CHLORHEXIDINE GLUCONATE CLOTH 2 % EX PADS: 6.0000 | MEDICATED_PAD | Freq: Once | CUTANEOUS | Status: DC

## 2017-08-11 ENCOUNTER — Encounter (HOSPITAL_COMMUNITY)
Admission: RE | Disposition: A | Discharge status: Home / Self Care | Payer: Self-pay | Source: Ambulatory Visit | Attending: General Surgery

## 2017-08-11 ENCOUNTER — Ambulatory Visit (HOSPITAL_COMMUNITY): Payer: BLUE CROSS/BLUE SHIELD | Admitting: Anesthesiology

## 2017-08-11 ENCOUNTER — Encounter (HOSPITAL_COMMUNITY): Payer: Self-pay

## 2017-08-11 ENCOUNTER — Ambulatory Visit (HOSPITAL_COMMUNITY)
Admission: RE | Admit: 2017-08-11 | Discharge: 2017-08-11 | Disposition: A | Discharge status: Home / Self Care | Payer: BLUE CROSS/BLUE SHIELD | Source: Ambulatory Visit | Attending: General Surgery | Admitting: General Surgery

## 2017-08-11 DIAGNOSIS — G8921 Chronic pain due to trauma: Secondary | ICD-10-CM | POA: Insufficient documentation

## 2017-08-11 DIAGNOSIS — M1990 Unspecified osteoarthritis, unspecified site: Secondary | ICD-10-CM | POA: Insufficient documentation

## 2017-08-11 DIAGNOSIS — K430 Incisional hernia with obstruction, without gangrene: Secondary | ICD-10-CM | POA: Diagnosis not present

## 2017-08-11 DIAGNOSIS — I10 Essential (primary) hypertension: Secondary | ICD-10-CM | POA: Insufficient documentation

## 2017-08-11 DIAGNOSIS — Z6836 Body mass index (BMI) 36.0-36.9, adult: Secondary | ICD-10-CM | POA: Insufficient documentation

## 2017-08-11 DIAGNOSIS — Z79899 Other long term (current) drug therapy: Secondary | ICD-10-CM | POA: Insufficient documentation

## 2017-08-11 DIAGNOSIS — E039 Hypothyroidism, unspecified: Secondary | ICD-10-CM | POA: Insufficient documentation

## 2017-08-11 DIAGNOSIS — E669 Obesity, unspecified: Secondary | ICD-10-CM | POA: Insufficient documentation

## 2017-08-11 DIAGNOSIS — F419 Anxiety disorder, unspecified: Secondary | ICD-10-CM | POA: Insufficient documentation

## 2017-08-11 DIAGNOSIS — Z79891 Long term (current) use of opiate analgesic: Secondary | ICD-10-CM | POA: Insufficient documentation

## 2017-08-11 DIAGNOSIS — K219 Gastro-esophageal reflux disease without esophagitis: Secondary | ICD-10-CM | POA: Insufficient documentation

## 2017-08-11 DIAGNOSIS — F329 Major depressive disorder, single episode, unspecified: Secondary | ICD-10-CM | POA: Insufficient documentation

## 2017-08-11 DIAGNOSIS — Z87891 Personal history of nicotine dependence: Secondary | ICD-10-CM | POA: Insufficient documentation

## 2017-08-11 HISTORY — PX: INSERTION OF MESH: SHX5868

## 2017-08-11 HISTORY — PX: UMBILICAL HERNIA REPAIR: SHX196

## 2017-08-11 SURGERY — REPAIR, HERNIA, UMBILICAL, LAPAROSCOPIC
Anesthesia: General | Site: Abdomen

## 2017-08-11 MED ORDER — SODIUM CHLORIDE 0.9 % IV SOLN: 250.0000 mL | INTRAVENOUS | Status: DC | PRN

## 2017-08-11 MED ORDER — PROPOFOL 10 MG/ML IV BOLUS
INTRAVENOUS | Status: DC | PRN
  Administered 2017-08-11: 150 mg via INTRAVENOUS

## 2017-08-11 MED ORDER — SUGAMMADEX SODIUM 200 MG/2ML IV SOLN
INTRAVENOUS | Status: AC
Start: 2017-08-11 — End: ?
  Filled 2017-08-11: qty 4

## 2017-08-11 MED ORDER — ACETAMINOPHEN 10 MG/ML IV SOLN
1000.0000 mg | INTRAVENOUS | Status: DC
  Filled 2017-08-11: qty 100

## 2017-08-11 MED ORDER — ACETAMINOPHEN 650 MG RE SUPP: 650.0000 mg | RECTAL | Status: DC | PRN

## 2017-08-11 MED ORDER — OXYCODONE HCL 5 MG PO TABS
ORAL_TABLET | ORAL | Status: AC
  Filled 2017-08-11: qty 2

## 2017-08-11 MED ORDER — ACETAMINOPHEN 10 MG/ML IV SOLN
INTRAVENOUS | Status: AC
Start: 2017-08-11 — End: ?
  Filled 2017-08-11: qty 100

## 2017-08-11 MED ORDER — OXYCODONE HCL 5 MG/5ML PO SOLN: 5.0000 mg | Freq: Once | ORAL | Status: DC | PRN

## 2017-08-11 MED ORDER — SCOPOLAMINE 1 MG/3DAYS TD PT72
1.0000 | MEDICATED_PATCH | TRANSDERMAL | Status: DC
  Administered 2017-08-11: 1.5 mg via TRANSDERMAL

## 2017-08-11 MED ORDER — FENTANYL CITRATE (PF) 250 MCG/5ML IJ SOLN
INTRAMUSCULAR | Status: AC
  Filled 2017-08-11: qty 5

## 2017-08-11 MED ORDER — PROMETHAZINE HCL 25 MG/ML IJ SOLN: 6.2500 mg | INTRAMUSCULAR | Status: DC | PRN

## 2017-08-11 MED ORDER — FENTANYL CITRATE (PF) 100 MCG/2ML IJ SOLN
INTRAMUSCULAR | Status: DC | PRN
  Administered 2017-08-11: 50 ug via INTRAVENOUS
  Administered 2017-08-11: 100 ug via INTRAVENOUS

## 2017-08-11 MED ORDER — SCOPOLAMINE 1 MG/3DAYS TD PT72
MEDICATED_PATCH | TRANSDERMAL | Status: AC
  Administered 2017-08-11: 1.5 mg via TRANSDERMAL
  Filled 2017-08-11: qty 1

## 2017-08-11 MED ORDER — LACTATED RINGERS IV SOLN
INTRAVENOUS | Status: DC
  Administered 2017-08-11 (×2): via INTRAVENOUS

## 2017-08-11 MED ORDER — ACETAMINOPHEN 500 MG PO TABS
1000.0000 mg | ORAL_TABLET | ORAL | Status: AC
  Administered 2017-08-11: 1000 mg via ORAL
  Filled 2017-08-11: qty 2

## 2017-08-11 MED ORDER — LIDOCAINE 2% (20 MG/ML) 5 ML SYRINGE
INTRAMUSCULAR | Status: DC | PRN
  Administered 2017-08-11: 60 mg via INTRAVENOUS

## 2017-08-11 MED ORDER — ROCURONIUM BROMIDE 10 MG/ML (PF) SYRINGE
PREFILLED_SYRINGE | INTRAVENOUS | Status: AC
  Filled 2017-08-11: qty 5

## 2017-08-11 MED ORDER — SUGAMMADEX SODIUM 200 MG/2ML IV SOLN
INTRAVENOUS | Status: DC | PRN
  Administered 2017-08-11: 200 mg via INTRAVENOUS

## 2017-08-11 MED ORDER — ACETAMINOPHEN 325 MG PO TABS: 650.0000 mg | ORAL_TABLET | ORAL | Status: DC | PRN

## 2017-08-11 MED ORDER — MORPHINE SULFATE (PF) 2 MG/ML IV SOLN: 2.0000 mg | INTRAVENOUS | Status: DC | PRN

## 2017-08-11 MED ORDER — OXYCODONE HCL 5 MG PO TABS: 5.0000 mg | ORAL_TABLET | Freq: Once | ORAL | Status: DC | PRN

## 2017-08-11 MED ORDER — ROCURONIUM BROMIDE 100 MG/10ML IV SOLN
INTRAVENOUS | Status: DC | PRN
  Administered 2017-08-11: 20 mg via INTRAVENOUS
  Administered 2017-08-11: 40 mg via INTRAVENOUS

## 2017-08-11 MED ORDER — ONDANSETRON HCL 4 MG/2ML IJ SOLN
INTRAMUSCULAR | Status: AC
  Filled 2017-08-11: qty 2

## 2017-08-11 MED ORDER — DEXAMETHASONE SODIUM PHOSPHATE 10 MG/ML IJ SOLN
INTRAMUSCULAR | Status: AC
  Filled 2017-08-11: qty 1

## 2017-08-11 MED ORDER — MIDAZOLAM HCL 2 MG/2ML IJ SOLN
INTRAMUSCULAR | Status: AC
  Filled 2017-08-11: qty 2

## 2017-08-11 MED ORDER — MIDAZOLAM HCL 5 MG/5ML IJ SOLN
INTRAMUSCULAR | Status: DC | PRN
  Administered 2017-08-11: 2 mg via INTRAVENOUS

## 2017-08-11 MED ORDER — DEXAMETHASONE SODIUM PHOSPHATE 4 MG/ML IJ SOLN
INTRAMUSCULAR | Status: DC | PRN
  Administered 2017-08-11: 10 mg via INTRAVENOUS

## 2017-08-11 MED ORDER — STERILE WATER FOR IRRIGATION IR SOLN
Status: DC | PRN
  Administered 2017-08-11: 1000 mL

## 2017-08-11 MED ORDER — LIDOCAINE 2% (20 MG/ML) 5 ML SYRINGE
INTRAMUSCULAR | Status: AC
Start: 2017-08-11 — End: ?
  Filled 2017-08-11: qty 5

## 2017-08-11 MED ORDER — CEFAZOLIN SODIUM-DEXTROSE 2-4 GM/100ML-% IV SOLN
2.0000 g | INTRAVENOUS | Status: AC
  Administered 2017-08-11: 2 g via INTRAVENOUS
  Filled 2017-08-11: qty 100

## 2017-08-11 MED ORDER — HYDROMORPHONE HCL 1 MG/ML IJ SOLN
INTRAMUSCULAR | Status: AC
  Filled 2017-08-11: qty 1

## 2017-08-11 MED ORDER — 0.9 % SODIUM CHLORIDE (POUR BTL) OPTIME
TOPICAL | Status: DC | PRN
Start: 2017-08-11 — End: 2017-08-11
  Administered 2017-08-11: 1000 mL

## 2017-08-11 MED ORDER — BUPIVACAINE HCL (PF) 0.25 % IJ SOLN
INTRAMUSCULAR | Status: AC
  Filled 2017-08-11: qty 30

## 2017-08-11 MED ORDER — HYDROMORPHONE HCL 1 MG/ML IJ SOLN
0.2500 mg | INTRAMUSCULAR | Status: DC | PRN
  Administered 2017-08-11: 0.5 mg via INTRAVENOUS

## 2017-08-11 MED ORDER — MEPERIDINE HCL 25 MG/ML IJ SOLN: 6.2500 mg | INTRAMUSCULAR | Status: DC | PRN

## 2017-08-11 MED ORDER — ONDANSETRON HCL 4 MG/2ML IJ SOLN
INTRAMUSCULAR | Status: DC | PRN
  Administered 2017-08-11: 4 mg via INTRAVENOUS

## 2017-08-11 MED ORDER — BUPIVACAINE LIPOSOME 1.3 % IJ SUSP
20.0000 mL | INTRAMUSCULAR | Status: AC
  Administered 2017-08-11: 20 mL
  Filled 2017-08-11: qty 20

## 2017-08-11 MED ORDER — SODIUM CHLORIDE 0.9% FLUSH: 3.0000 mL | Freq: Two times a day (BID) | INTRAVENOUS | Status: DC

## 2017-08-11 MED ORDER — OXYCODONE HCL 5 MG PO TABS
5.0000 mg | ORAL_TABLET | ORAL | Status: DC | PRN
  Administered 2017-08-11: 10 mg via ORAL

## 2017-08-11 MED ORDER — BUPIVACAINE HCL 0.25 % IJ SOLN
INTRAMUSCULAR | Status: DC | PRN
  Administered 2017-08-11: 3 mL
  Administered 2017-08-11: 20 mL

## 2017-08-11 MED ORDER — GABAPENTIN 300 MG PO CAPS
300.0000 mg | ORAL_CAPSULE | ORAL | Status: AC
  Administered 2017-08-11: 300 mg via ORAL
  Filled 2017-08-11: qty 1

## 2017-08-11 MED ORDER — SODIUM CHLORIDE 0.9% FLUSH: 3.0000 mL | INTRAVENOUS | Status: DC | PRN

## 2017-08-11 SURGICAL SUPPLY — 45 items
APL SKNCLS STERI-STRIP NONHPOA (GAUZE/BANDAGES/DRESSINGS) ×1
BENZOIN TINCTURE PRP APPL 2/3 (GAUZE/BANDAGES/DRESSINGS) ×3 IMPLANT
CHLORAPREP W/TINT 26ML (MISCELLANEOUS) ×3 IMPLANT
CLOSURE WOUND 1/2 X4 (GAUZE/BANDAGES/DRESSINGS) ×1
COVER SURGICAL LIGHT HANDLE (MISCELLANEOUS) ×3 IMPLANT
DEVICE SECURE STRAP 25 ABSORB (INSTRUMENTS) ×5 IMPLANT
DEVICE TROCAR PUNCTURE CLOSURE (ENDOMECHANICALS) ×2 IMPLANT
ELECT REM PT RETURN 9FT ADLT (ELECTROSURGICAL) ×3
ELECTRODE REM PT RTRN 9FT ADLT (ELECTROSURGICAL) ×1 IMPLANT
GAUZE SPONGE 2X2 8PLY STRL LF (GAUZE/BANDAGES/DRESSINGS) ×1 IMPLANT
GLOVE BIO SURGEON STRL SZ7 (GLOVE) ×2 IMPLANT
GLOVE BIO SURGEON STRL SZ7.5 (GLOVE) ×3 IMPLANT
GLOVE BIOGEL PI IND STRL 6.5 (GLOVE) IMPLANT
GLOVE BIOGEL PI IND STRL 7.0 (GLOVE) IMPLANT
GLOVE BIOGEL PI IND STRL 8 (GLOVE) IMPLANT
GLOVE BIOGEL PI INDICATOR 6.5 (GLOVE) ×2
GLOVE BIOGEL PI INDICATOR 7.0 (GLOVE) ×2
GLOVE BIOGEL PI INDICATOR 8 (GLOVE) ×2
GLOVE SURG SS PI 6.0 STRL IVOR (GLOVE) ×2 IMPLANT
GOWN STRL REUS W/ TWL LRG LVL3 (GOWN DISPOSABLE) ×2 IMPLANT
GOWN STRL REUS W/ TWL XL LVL3 (GOWN DISPOSABLE) ×1 IMPLANT
GOWN STRL REUS W/TWL LRG LVL3 (GOWN DISPOSABLE) ×6
GOWN STRL REUS W/TWL XL LVL3 (GOWN DISPOSABLE) ×3
GRASPER SUT TROCAR 14GX15 (MISCELLANEOUS) ×3 IMPLANT
KIT BASIN OR (CUSTOM PROCEDURE TRAY) ×3 IMPLANT
KIT ROOM TURNOVER OR (KITS) ×3 IMPLANT
MARKER SKIN DUAL TIP RULER LAB (MISCELLANEOUS) ×3 IMPLANT
MESH VENTRALIGHT ST 6IN CRC (Mesh General) ×2 IMPLANT
NDL INSUFFLATION 14GA 120MM (NEEDLE) ×1 IMPLANT
NDL SPNL 22GX3.5 QUINCKE BK (NEEDLE) IMPLANT
NEEDLE INSUFFLATION 14GA 120MM (NEEDLE) ×3 IMPLANT
NEEDLE SPNL 22GX3.5 QUINCKE BK (NEEDLE) ×3 IMPLANT
NS IRRIG 1000ML POUR BTL (IV SOLUTION) ×3 IMPLANT
PAD ARMBOARD 7.5X6 YLW CONV (MISCELLANEOUS) ×6 IMPLANT
SCISSORS LAP 5X35 DISP (ENDOMECHANICALS) ×3 IMPLANT
SLEEVE ENDOPATH XCEL 5M (ENDOMECHANICALS) ×5 IMPLANT
SPONGE GAUZE 2X2 STER 10/PKG (GAUZE/BANDAGES/DRESSINGS) ×2
STRIP CLOSURE SKIN 1/2X4 (GAUZE/BANDAGES/DRESSINGS) ×2 IMPLANT
SUT CHROMIC 2 0 SH (SUTURE) ×3 IMPLANT
SUT ETHIBOND 0 MO6 C/R (SUTURE) ×2 IMPLANT
SUT MNCRL AB 4-0 PS2 18 (SUTURE) ×3 IMPLANT
TAPE CLOTH SURG 4X10 WHT LF (GAUZE/BANDAGES/DRESSINGS) ×2 IMPLANT
TRAY LAPAROSCOPIC MC (CUSTOM PROCEDURE TRAY) ×3 IMPLANT
TROCAR XCEL NON-BLD 5MMX100MML (ENDOMECHANICALS) ×3 IMPLANT
TUBING INSUFFLATION (TUBING) ×3 IMPLANT

## 2017-08-11 NOTE — Anesthesia Procedure Notes (Signed)
Procedure Name: Intubation Date/Time: 08/11/2017 2:13 PM Performed by: Montez Moritaarter, Delaine Canter W, CRNA Pre-anesthesia Checklist: Patient identified, Emergency Drugs available, Suction available and Patient being monitored Patient Re-evaluated:Patient Re-evaluated prior to induction Oxygen Delivery Method: Circle system utilized Preoxygenation: Pre-oxygenation with 100% oxygen Induction Type: IV induction Ventilation: Mask ventilation without difficulty Laryngoscope Size: Miller and 2 Grade View: Grade I Tube type: Oral Tube size: 7.0 mm Number of attempts: 1 Airway Equipment and Method: Stylet and Oral airway Placement Confirmation: ETT inserted through vocal cords under direct vision,  positive ETCO2 and breath sounds checked- equal and bilateral Secured at: 21 cm Tube secured with: Tape Dental Injury: Teeth and Oropharynx as per pre-operative assessment

## 2017-08-11 NOTE — Transfer of Care (Signed)
Immediate Anesthesia Transfer of Care Note  Patient: [name]  Procedure(s) Performed: LAPAROSCOPIC UMBILICAL HERNIA REPAIR (N/A Abdomen) INSERTION OF MESH (N/A Abdomen)  Patient Location: PACU  Anesthesia Type:General  Level of Consciousness: awake  Airway & Oxygen Therapy: Patient Spontanous Breathing and Patient connected to face mask oxygen  Post-op Assessment: Report given to RN and Post -op Vital signs reviewed and stable  Post vital signs: Reviewed and stable  Last Vitals:  Vitals:   08/11/17 1310 08/11/17 1511  BP: 121/71 (!) 122/106  Pulse: 76 (!) 101  Resp: 18 13  Temp:  36.5 C  SpO2: 95% 98%    Last Pain:  Vitals:   08/11/17 1511  PainSc: Asleep         Complications: No apparent anesthesia complications

## 2017-08-11 NOTE — Op Note (Signed)
08/11/2017  3:01 PM  PATIENT:  [name]  52 y.o. female  PRE-OPERATIVE DIAGNOSIS:  INCARCERATED HERNIA  POST-OPERATIVE DIAGNOSIS:  INCARCERATED INCISIONAL HERNIA  PROCEDURE:  Procedure(s): LAPAROSCOPIC UMBILICAL HERNIA REPAIR (N/A) INSERTION OF MESH (N/A)  SURGEON:  Surgeon(s) and Role:    * Axel Filleramirez, Ejay Lashley, MD - Primary  ANESTHESIA:   local and general  EBL:  minimal   BLOOD ADMINISTERED:none  DRAINS: none   LOCAL MEDICATIONS USED:  BUPIVICAINE  and OTHER eXPARIL  SPECIMEN:  No Specimen  DISPOSITION OF SPECIMEN:  N/A  COUNTS:  YES  TOURNIQUET:  * No tourniquets in log *  DICTATION: .Dragon Dictation   Details of the procedure:   After the patient was consented patient was taken back to the operating room patient was then placed in supine position bilateral SCDs in place.  The patient was prepped and draped in the usual sterile fashion. After antibiotics were confirmed a timeout was called and all facts were verified. The Veress needle technique was used to insuflate the abdomen at Palmer's point. The abdomen was insufflated to 14 mm mercury. Subsequently a 5 mm trocar was placed a camera inserted there was no injury to any intra-abdominal organs.    There was seen to be an incarcerated  incisional hernia.  A second camera port was in placed into the left lower quadrant.   At this the Falicform ligament was taken down with Bovie cautery maintaining hemostasis. A 5mm port was placed in the left upper quadrant.   I proceeded to reduce the hernia contents.  Exparil was than used to infiltrate the preperitoneal space circumfrentially where the mesh was to be placed.  Once the hernia was cleared away, a Bard Ventralight 15.2cm  mesh was inserted into the abdomen.  The mesh was secured circumferentially with am Securestrap tacker in a double crown fashion.   The omentum was brought over the area of the mesh. The pneumoperitoneum was evacuated  & all trocars  were removed.  The skin was reapproximated with 4-0  Monocryl sutures in a subcuticular fashion. The skin was dressed with Steri-Strips tape and gauze.  The patient was taken to the recovery room in stable condition.   PLAN OF CARE: Discharge to home after PACU  PATIENT DISPOSITION:  PACU - hemodynamically stable.   Delay start of Pharmacological VTE agent (>24hrs) due to surgical blood loss or risk of bleeding: not applicable

## 2017-08-11 NOTE — Anesthesia Preprocedure Evaluation (Signed)
Anesthesia Evaluation  Patient identified by MRN, date of birth, ID band Patient awake    Reviewed: Allergy & Precautions, NPO status , Patient's Chart, lab work & pertinent test results  History of Anesthesia Complications (+) PONVNegative for: history of anesthetic complications  Airway Mallampati: II  TM Distance: >3 FB Neck ROM: Full    Dental no notable dental hx. (+) Dental Advisory Given   Pulmonary Current Smoker, former smoker,    Pulmonary exam normal breath sounds clear to auscultation       Cardiovascular negative cardio ROS Normal cardiovascular exam Rhythm:Regular Rate:Normal     Neuro/Psych Anxiety Depression negative neurological ROS  negative psych ROS   GI/Hepatic negative GI ROS, Neg liver ROS, GERD  ,  Endo/Other  Hypothyroidism   Renal/GU negative Renal ROS  negative genitourinary   Musculoskeletal negative musculoskeletal ROS (+) Arthritis , Osteoarthritis,    Abdominal (+) + obese,   Peds negative pediatric ROS (+)  Hematology negative hematology ROS (+)   Anesthesia Other Findings   Reproductive/Obstetrics negative OB ROS                             Anesthesia Physical  Anesthesia Plan  ASA: II  Anesthesia Plan: General   Post-op Pain Management:    Induction: Intravenous  PONV Risk Score and Plan: 4 or greater and Ondansetron, Dexamethasone, Midazolam and Scopolamine patch - Pre-op  Airway Management Planned: Oral ETT  Additional Equipment:   Intra-op Plan:   Post-operative Plan: Extubation in OR  Informed Consent: I have reviewed the patients History and Physical, chart, labs and discussed the procedure including the risks, benefits and alternatives for the proposed anesthesia with the patient or authorized representative who has indicated his/her understanding and acceptance.   Dental advisory given  Plan Discussed with: CRNA  Anesthesia  Plan Comments:         Anesthesia Quick Evaluation

## 2017-08-11 NOTE — Discharge Instructions (Signed)
[facility] _______[facility] ° °UMBILICAL  HERNIA REPAIR: POST OP INSTRUCTIONS ° °Always review your discharge instruction sheet given to you by the facility where your surgery was performed. °IF YOU HAVE DISABILITY OR FAMILY LEAVE FORMS, YOU MUST BRING THEM TO THE OFFICE FOR PROCESSING.   °DO NOT GIVE THEM TO YOUR DOCTOR. ° °1. A  prescription for pain medication may be given to you upon discharge.  Take your pain medication as prescribed, if needed.  If narcotic pain medicine is not needed, then you may take acetaminophen (Tylenol) or ibuprofen (Advil) as needed. °2. Take your usually prescribed medications unless otherwise directed. °If you need a refill on your pain medication, please contact your pharmacy.  They will contact our office to request authorization. Prescriptions will not be filled after 5 pm or on week-ends. °3. You should follow a light diet the first 24 hours after arrival home, such as soup and crackers, etc.  Be sure to include lots of fluids daily.  Resume your normal diet the day after surgery. °4.Most patients will experience some swelling and bruising around the umbilicus or in the groin and scrotum.  Ice packs and reclining will help.  Swelling and bruising can take several days to resolve.  °6. It is common to experience some constipation if taking pain medication after surgery.  Increasing fluid intake and taking a stool softener (such as Colace) will usually help or prevent this problem from occurring.  A mild laxative (Milk of Magnesia or Miralax) should be taken according to package directions if there are no bowel movements after 48 hours. °7. Unless discharge instructions indicate otherwise, you may remove your bandages 24-48 hours after surgery, and you may shower at that time.  You may have steri-strips (small skin tapes) in place directly over the incision.  These strips should be left on the skin for 7-10 days.  If your surgeon used skin glue on the incision, you may  shower in 24 hours.  The glue will flake off over the next 2-3 weeks.  Any sutures or staples will be removed at the office during your follow-up visit. °8. ACTIVITIES:  You may resume regular (light) daily activities beginning the next day--such as daily self-care, walking, climbing stairs--gradually increasing activities as tolerated.  You may have sexual intercourse when it is comfortable.  Refrain from any heavy lifting or straining until approved by your doctor. ° °a.You may drive when you are no longer taking prescription pain medication, you can comfortably wear a seatbelt, and you can safely maneuver your car and apply brakes. °b.RETURN TO WORK:   °_____________________________________________ ° °9.You should see your doctor in the office for a follow-up appointment approximately 2-3 weeks after your surgery.  Make sure that you call for this appointment within a day or two after you arrive home to insure a convenient appointment time. °10.OTHER INSTRUCTIONS: _________________________ °   _____________________________________ ° °WHEN TO CALL YOUR DOCTOR: °1. Fever over 101.0 °2. Inability to urinate °3. Nausea and/or vomiting °4. Extreme swelling or bruising °5. Continued bleeding from incision. °6. Increased pain, redness, or drainage from the incision ° °The clinic staff is available to answer your questions during regular business hours.  Please don’t hesitate to call and ask to speak to one of the nurses for clinical concerns.  If you have a medical emergency, go to the nearest emergency room or call 911.  A surgeon from [facility] is always on call at the hospital ° ° °[address], [address], [place], [place]  [address] ? ° [address] [address], [place], [place]   [address] °[phone] ? [phone] ? FAX [phone] °Web site: [email or web address] ° °

## 2017-08-11 NOTE — Interval H&P Note (Signed)
History and Physical Interval Note:  08/11/2017 1:44 PM  [name]  has presented today for surgery, with the diagnosis of INCARCERATED HERNIA  The various methods of treatment have been discussed with the patient and family. After consideration of risks, benefits and other options for treatment, the patient has consented to  Procedure(s): LAPAROSCOPIC UMBILICAL HERNIA REPAIR WITH MESH (N/A) INSERTION OF MESH (N/A) as a surgical intervention .  The patient's history has been reviewed, patient examined, no change in status, stable for surgery.  I have reviewed the patient's chart and labs.  Questions were answered to the patient's satisfaction.     Marigene Ehlersamirez Jr., Jed LimerickArmando

## 2017-08-14 ENCOUNTER — Encounter (HOSPITAL_COMMUNITY): Payer: Self-pay | Admitting: General Surgery

## 2017-08-14 NOTE — Anesthesia Postprocedure Evaluation (Signed)
Anesthesia Post Note  Patient: [name]  Procedure(s) Performed: LAPAROSCOPIC UMBILICAL HERNIA REPAIR (N/A Abdomen) INSERTION OF MESH (N/A Abdomen)     Patient location during evaluation: PACU Anesthesia Type: General Level of consciousness: awake and alert Pain management: pain level controlled Vital Signs Assessment: post-procedure vital signs reviewed and stable Respiratory status: spontaneous breathing, nonlabored ventilation, respiratory function stable and patient connected to nasal cannula oxygen Cardiovascular status: blood pressure returned to baseline and stable Postop Assessment: no apparent nausea or vomiting Anesthetic complications: no    Last Vitals:  Vitals:   08/11/17 1626 08/11/17 1644  BP: (!) 135/94 136/76  Pulse: 80 79  Resp: 15 16  Temp: 36.7 C   SpO2: 95% 95%    Last Pain:  Vitals:   08/11/17 1644  PainSc: 0-No pain                 Elleanor Guyett

## 2017-09-06 ENCOUNTER — Other Ambulatory Visit: Payer: Self-pay | Admitting: Obstetrics and Gynecology

## 2017-09-06 DIAGNOSIS — N838 Other noninflammatory disorders of ovary, fallopian tube and broad ligament: Secondary | ICD-10-CM

## 2017-09-06 DIAGNOSIS — R102 Pelvic and perineal pain: Secondary | ICD-10-CM

## 2017-09-12 ENCOUNTER — Other Ambulatory Visit: Payer: BLUE CROSS/BLUE SHIELD

## 2017-09-13 ENCOUNTER — Institutional Professional Consult (permissible substitution): Payer: BLUE CROSS/BLUE SHIELD | Admitting: Internal Medicine

## 2017-10-11 ENCOUNTER — Other Ambulatory Visit: Payer: BLUE CROSS/BLUE SHIELD

## 2017-10-18 ENCOUNTER — Institutional Professional Consult (permissible substitution): Payer: BLUE CROSS/BLUE SHIELD | Admitting: Pulmonary Disease

## 2017-10-26 DIAGNOSIS — R400 Somnolence: Secondary | ICD-10-CM | POA: Insufficient documentation

## 2017-10-30 ENCOUNTER — Ambulatory Visit
Admission: RE | Admit: 2017-10-30 | Discharge: 2017-10-30 | Disposition: A | Discharge status: Home / Self Care | Payer: BLUE CROSS/BLUE SHIELD | Source: Ambulatory Visit | Attending: Obstetrics and Gynecology | Admitting: Obstetrics and Gynecology

## 2017-10-30 DIAGNOSIS — R102 Pelvic and perineal pain: Secondary | ICD-10-CM

## 2017-10-30 DIAGNOSIS — N838 Other noninflammatory disorders of ovary, fallopian tube and broad ligament: Secondary | ICD-10-CM

## 2017-11-08 ENCOUNTER — Other Ambulatory Visit: Payer: BLUE CROSS/BLUE SHIELD

## 2018-12-14 ENCOUNTER — Telehealth: Payer: Self-pay | Admitting: Pulmonary Disease

## 2018-12-14 NOTE — Telephone Encounter (Signed)
LVM for pt to call and schedule appt per online request

## 2019-03-19 DIAGNOSIS — R5381 Other malaise: Secondary | ICD-10-CM | POA: Insufficient documentation

## 2019-07-13 ENCOUNTER — Emergency Department (HOSPITAL_COMMUNITY): Payer: 59

## 2019-07-13 ENCOUNTER — Other Ambulatory Visit: Payer: Self-pay

## 2019-07-13 ENCOUNTER — Emergency Department (HOSPITAL_COMMUNITY)
Admission: EM | Admit: 2019-07-13 | Discharge: 2019-07-13 | Disposition: A | Discharge status: Home / Self Care | Payer: 59 | Attending: Emergency Medicine | Admitting: Emergency Medicine

## 2019-07-13 ENCOUNTER — Encounter (HOSPITAL_COMMUNITY): Payer: Self-pay

## 2019-07-13 DIAGNOSIS — R1013 Epigastric pain: Secondary | ICD-10-CM | POA: Diagnosis present

## 2019-07-13 DIAGNOSIS — K209 Esophagitis, unspecified without bleeding: Secondary | ICD-10-CM | POA: Insufficient documentation

## 2019-07-13 DIAGNOSIS — F17210 Nicotine dependence, cigarettes, uncomplicated: Secondary | ICD-10-CM | POA: Diagnosis not present

## 2019-07-13 LAB — CBC WITH DIFFERENTIAL/PLATELET
Abs Immature Granulocytes: 0.04 10*3/uL (ref 0.00–0.07)
Basophils Absolute: 0.1 10*3/uL (ref 0.0–0.1)
Basophils Relative: 1 %
Eosinophils Absolute: 0.5 10*3/uL (ref 0.0–0.5)
Eosinophils Relative: 5 %
HCT: 42.9 % (ref 36.0–46.0)
Hemoglobin: 13.9 g/dL (ref 12.0–15.0)
Immature Granulocytes: 0 %
Lymphocytes Relative: 28 %
Lymphs Abs: 2.6 10*3/uL (ref 0.7–4.0)
MCH: 32.8 pg (ref 26.0–34.0)
MCHC: 32.4 g/dL (ref 30.0–36.0)
MCV: 101.2 fL — ABNORMAL HIGH (ref 80.0–100.0)
Monocytes Absolute: 0.6 10*3/uL (ref 0.1–1.0)
Monocytes Relative: 6 %
Neutro Abs: 5.5 10*3/uL (ref 1.7–7.7)
Neutrophils Relative %: 60 %
Platelets: 263 10*3/uL (ref 150–400)
RBC: 4.24 MIL/uL (ref 3.87–5.11)
RDW: 14 % (ref 11.5–15.5)
WBC: 9.3 10*3/uL (ref 4.0–10.5)
nRBC: 0 % (ref 0.0–0.2)

## 2019-07-13 LAB — URINALYSIS, ROUTINE W REFLEX MICROSCOPIC
Bacteria, UA: NONE SEEN
Bilirubin Urine: NEGATIVE
Glucose, UA: NEGATIVE mg/dL
Ketones, ur: NEGATIVE mg/dL
Leukocytes,Ua: NEGATIVE
Nitrite: NEGATIVE
Protein, ur: NEGATIVE mg/dL
Specific Gravity, Urine: 1.012 (ref 1.005–1.030)
pH: 6 (ref 5.0–8.0)

## 2019-07-13 LAB — COMPREHENSIVE METABOLIC PANEL
ALT: 14 U/L (ref 0–44)
AST: 21 U/L (ref 15–41)
Albumin: 3.4 g/dL — ABNORMAL LOW (ref 3.5–5.0)
Alkaline Phosphatase: 82 U/L (ref 38–126)
Anion gap: 9 (ref 5–15)
BUN: 10 mg/dL (ref 6–20)
CO2: 29 mmol/L (ref 22–32)
Calcium: 8.3 mg/dL — ABNORMAL LOW (ref 8.9–10.3)
Chloride: 101 mmol/L (ref 98–111)
Creatinine, Ser: 0.81 mg/dL (ref 0.44–1.00)
GFR calc Af Amer: >60 mL/min (ref >60)
GFR calc non Af Amer: >60 mL/min (ref >60)
Glucose, Bld: 107 mg/dL — ABNORMAL HIGH (ref 70–99)
Potassium: 4 mmol/L (ref 3.5–5.1)
Sodium: 139 mmol/L (ref 135–145)
Total Bilirubin: 0.7 mg/dL (ref 0.3–1.2)
Total Protein: 6.7 g/dL (ref 6.5–8.1)

## 2019-07-13 LAB — LIPASE, BLOOD: Lipase: 20 U/L (ref 11–51)

## 2019-07-13 MED ORDER — IOHEXOL 300 MG/ML  SOLN
100.0000 mL | Freq: Once | INTRAMUSCULAR | Status: AC | PRN
  Administered 2019-07-13: 100 mL via INTRAVENOUS

## 2019-07-13 MED ORDER — SODIUM CHLORIDE 0.9 % IV BOLUS
1000.0000 mL | Freq: Once | INTRAVENOUS | Status: AC
  Administered 2019-07-13: 1000 mL via INTRAVENOUS

## 2019-07-13 MED ORDER — FENTANYL CITRATE (PF) 100 MCG/2ML IJ SOLN: 50.0000 ug | Freq: Once | INTRAMUSCULAR | Status: DC

## 2019-07-13 MED ORDER — PANTOPRAZOLE SODIUM 40 MG IV SOLR
40.0000 mg | Freq: Once | INTRAVENOUS | Status: AC
  Administered 2019-07-13: 40 mg via INTRAVENOUS
  Filled 2019-07-13: qty 40

## 2019-07-13 MED ORDER — FAMOTIDINE 20 MG PO TABS: 20.0000 mg | ORAL_TABLET | Freq: Two times a day (BID) | ORAL | 0 refills | Status: DC | PRN

## 2019-07-13 MED ORDER — PROMETHAZINE HCL 25 MG PO TABS: 25.0000 mg | ORAL_TABLET | Freq: Four times a day (QID) | ORAL | 0 refills | Status: AC | PRN

## 2019-07-13 MED ORDER — FENTANYL CITRATE (PF) 100 MCG/2ML IJ SOLN
50.0000 ug | Freq: Once | INTRAMUSCULAR | Status: AC
  Administered 2019-07-13: 50 ug via INTRAVENOUS
  Filled 2019-07-13: qty 2

## 2019-07-13 MED ORDER — SUCRALFATE 1 G PO TABS: 1.0000 g | ORAL_TABLET | Freq: Three times a day (TID) | ORAL | 0 refills | Status: AC

## 2019-07-13 MED ORDER — SODIUM CHLORIDE (PF) 0.9 % IJ SOLN
INTRAMUSCULAR | Status: AC
  Administered 2019-07-13: 19:00:00
  Filled 2019-07-13: qty 50

## 2019-07-13 MED ORDER — PROMETHAZINE HCL 25 MG/ML IJ SOLN
25.0000 mg | Freq: Once | INTRAMUSCULAR | Status: AC
  Administered 2019-07-13: 25 mg via INTRAVENOUS
  Filled 2019-07-13: qty 1

## 2019-07-13 NOTE — ED Notes (Signed)
Pt 90% oxygen on room air. Placed pt on oxygen via [place] at 2lpm and oxygen level increased to 95%.

## 2019-07-13 NOTE — ED Notes (Signed)
Patient ambulated to restroom unassisted.

## 2019-07-13 NOTE — ED Triage Notes (Addendum)
Arrived by [facility] from home. EMS reports patient c/o lower abdominal pain X3 days; patient self reports epigastric pain. Patient states she "spit up blood" today. Ambulated from EMS stretcher to ED stretcher. Received 100 mcg fentanyl IV en route to this facility. Placed on 3L/ by EMS

## 2019-07-13 NOTE — Discharge Instructions (Addendum)
You have inflamed esophagus.   Avoid any spicy food.   Continue protonix.   Take pepcid as needed   Take carafate as well   Take phenergan for nausea   See [facility] for follow up. You may need endoscopy   Return to ER if you have worse abdominal pain, vomiting.

## 2019-07-13 NOTE — ED Notes (Signed)
O2 turned off, patient 96% on room air

## 2019-07-13 NOTE — ED Provider Notes (Signed)
[facility]-EMERGENCY DEPT Provider Note   CSN: [id number] Arrival date & time: 07/13/19  1658     History   Chief Complaint Chief Complaint  Patient presents with  . Abdominal Pain    bilateral lower    HPI [name] is a 53 y.o. 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  Patient states that she been having lower abdominal pain for the last several days.  States that today she had epigastric pain that is new and associated with some nausea and blood-tinged sputum.  Patient denies any alcohol use or NSAID use.  Patient states that she still passing gas.  She does have multiple abdominal surgeries including cholecystectomy and partial oophorectomy.  Patient states that she is on methadone and oxycodone chronically for her back.     The history is provided by the patient.    Past Medical History:  Diagnosis Date  . Anxiety   . Arthritis   . Chronic back pain    had n/v and  fainting after procedure. N/v continues  . Chronic pain   . Cysts of both ovaries   . Depression   . GERD (gastroesophageal reflux disease)   . PONV (postoperative nausea and vomiting)     Patient Active Problem List   Diagnosis Date Noted  . Acute cholecystitis 09/07/2015  . 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 Abnormal weight gain 08/18/2015  . Obesity BMI 31 05/17/2013    Past Surgical History:  Procedure Laterality Date  . ABDOMINAL HYSTERECTOMY    . BACK SURGERY    . BARTHOLIN GLAND CYST EXCISION    . CERVICAL FUSION     plates and screws in neck  . CHOLECYSTECTOMY N/A 09/08/2015   Procedure: LAPAROSCOPIC CHOLECYSTECTOMY WITH INTRAOPERATIVE CHOLANGIOGRAM;  Surgeon: Chevis Pretty III, MD;  Location: [facility] ORS;  Service: General;  Laterality: N/A;  . HERNIA REPAIR    . INSERTION OF MESH N/A 08/11/2017   Procedure: INSERTION OF MESH;  Surgeon: Axel Filler, MD;   Location: [facility] OR;  Service: General;  Laterality: N/A;  . LEFT OOPHORECTOMY     intestine twisted around tube and ovary   . UMBILICAL HERNIA REPAIR N/A 08/11/2017   Procedure: LAPAROSCOPIC UMBILICAL HERNIA REPAIR;  Surgeon: Axel Filler, MD;  Location: [facility] OR;  Service: General;  Laterality: N/A;     OB History    Gravida  2   Para      Term      Preterm      AB      Living  2     SAB      TAB      Ectopic      Multiple      Live Births               Home Medications    Prior to Admission medications   Not on File    Family History Family History  Problem Relation Age of Onset  . Diabetes Mother   . Heart disease Mother   . Heart disease Sister   . Heart disease Brother   . Thyroid disease Neg Hx     Social History Social History   Tobacco Use  . Smoking status: Current Every Day Smoker    Packs/day: 0.50    Years: 35.00    Pack years: 17.50    Types: Cigarettes    Last attempt to quit:  08/09/2017    Years since quitting: 1.9  . Smokeless tobacco: Never Used  Substance Use Topics  . Alcohol use: No  . Drug use: No     Allergies   Codeine, Duloxetine, Nsaids, Sumatriptan succinate, Zolmitriptan, Citalopram, and Corticosteroids   Review of Systems Review of Systems  Gastrointestinal: Positive for abdominal pain.  All other systems reviewed and are negative.    Physical Exam Updated Vital Signs BP 123/76 (BP Location: Right Arm)   Pulse 92   Temp 98.3 F (36.8 C) (Oral)   Resp 16   Ht 5\' 2"  (1.575 m)   Wt 84.8 kg   SpO2 100%   BMI 34.20 kg/m   Physical Exam Vitals signs and nursing note reviewed.  Constitutional:      Appearance: She is well-developed.     Comments: Slightly uncomfortable   HENT:     Head: Normocephalic.  Eyes:     Extraocular Movements: Extraocular movements intact.  Cardiovascular:     Rate and Rhythm: Normal rate and regular rhythm.     Heart sounds: Normal heart sounds.  Pulmonary:     Effort:  Pulmonary effort is normal.     Breath sounds: Normal breath sounds.  Abdominal:     General: Abdomen is flat.     Palpations: Abdomen is soft.     Comments: + epigastric tenderness, no RUQ   Skin:    General: Skin is warm.     Capillary Refill: Capillary refill takes less than 2 seconds.  Neurological:     General: No focal deficit present.     Mental Status: She is alert and oriented to person, place, and time.  Psychiatric:        Mood and Affect: Mood normal.        Behavior: Behavior normal.      ED Treatments / Results  Labs (all labs ordered are listed, but only abnormal results are displayed) Labs Reviewed  CBC WITH DIFFERENTIAL/PLATELET - Abnormal; Notable for the following components:      Result Value   MCV 101.2 (*)    All other components within normal limits  COMPREHENSIVE METABOLIC PANEL - Abnormal; Notable for the following components:   Glucose, Bld 107 (*)    Calcium 8.3 (*)    Albumin 3.4 (*)    All other components within normal limits  URINALYSIS, ROUTINE W REFLEX MICROSCOPIC - Abnormal; Notable for the following components:   Hgb urine dipstick SMALL (*)    All other components within normal limits  LIPASE, BLOOD    EKG None  Radiology No results found.  Procedures Procedures (including critical care time)  Medications Ordered in ED Medications  sodium chloride 0.9 % bolus 1,000 mL (1,000 mLs Intravenous New Bag/Given 07/13/19 1808)  fentaNYL (SUBLIMAZE) injection 50 mcg (50 mcg Intravenous Given 07/13/19 1808)  promethazine (PHENERGAN) injection 25 mg (25 mg Intravenous Given 07/13/19 1808)     Initial Impression / Assessment and Plan / ED Course  I have reviewed the triage vital signs and the nursing notes.  Pertinent labs & imaging results that were available during my care of the patient were reviewed by me and considered in my medical decision making (see chart for details).        [name] is a 53 y.o. female here with  epigastric pain, vomiting. Likely gastritis vs SBO. Will get labs, CT ab/pel, UA. Will hydrate and give protonix and antiemetics and reassess.   7:32 PM Labs unremarkable.  CT showed distal esophagitis likely causing her symptoms. She is on prilosec already. Will add pepcid. Will have her follow up with GI for endoscopy.    Final Clinical Impressions(s) / ED Diagnoses   Final diagnoses:  None    ED Discharge Orders    None       Drenda Freeze, MD 07/13/19 [phone]

## 2019-07-29 ENCOUNTER — Ambulatory Visit: Payer: 59 | Admitting: Physician Assistant

## 2019-08-28 ENCOUNTER — Encounter: Payer: Self-pay | Admitting: *Deleted

## 2019-08-28 ENCOUNTER — Ambulatory Visit: Payer: 59 | Admitting: Gastroenterology

## 2019-08-28 ENCOUNTER — Other Ambulatory Visit: Payer: Self-pay

## 2019-08-28 DIAGNOSIS — K810 Acute cholecystitis: Secondary | ICD-10-CM

## 2020-02-29 NOTE — H&P (Signed)
NAME: [name], DEHNE MEDICAL RECORD [id number] ACCOUNT [id number] DATE OF BIRTH:Feb 03, 1966 FACILITY: [facility] LOCATION:  PHYSICIAN:Ronin Crager Milana Obey, MD  HISTORY AND PHYSICAL  DATE OF ADMISSION:  03/16/2020  CHIEF COMPLAINT:  Chronic pelvic pain.  HISTORY OF PRESENT ILLNESS:  [name].  She has had 2 prior vaginal deliveries.  Her history is significant for LAVH with lysis of adhesions 2007, in 2008 had a laparoscopy, lysis of adhesions with LSO.  Her right ovary appeared to be  normal at that time.  In the interim, she has had other surgeries, umbilical hernia repair with mesh and has had a tummy tuck without mesh.  Prior to that, had a tubal back in 1994 and LEEP in 1995.  She continues to have problems related to pelvic pain, especially on the  right and presents now for laparotomy with RSO.  This procedure including risks regarding bleeding, infection, transfusion, wound infection, phlebitis, along with her expected recovery time discussed.  Management of vasomotor symptoms and estrogen  deficiency discussed.  We are taking this approach because of her prior history of tummy tuck, suspected right adhesive disease and umbilical hernia repair with mesh.  PAST MEDICAL ALLERGIES:  CODEINE, STEROIDS.  REVIEW OF SYSTEMS:  Significant for OCD, history of ulcer disease, anxiety, osteoarthritis, HSV.  MEDICATIONS:  B12, Flexeril as needed, diazepam as needed for anxiety, Pepcid daily, methadone for chronic pain management, metoclopramide 10 mg daily, omeprazole once daily 40 mg, phentermine 37.5 one-half tablet twice a day, promethazine as needed for  nausea, sucralfate 1 g tablet twice a day for reflux, Valtrex as needed.  FAMILY HISTORY:  Significant for mother and father with hypertension, mother with diabetes.  Her sisters with asthma.  SOCIAL HISTORY:  She is married.  She is still smoking 1/2 PPD.  Denies alcohol or other drug use.  OTHER SURGERY:  In addition to  listed above, she has had breast implants and mini tummy tuck.  PHYSICAL EXAMINATION: VITAL SIGNS:  Weight is 190, BMI 34.3, blood pressure 118/80. HEENT:  Unremarkable. NECK:  Supple, without masses. LUNGS:  Clear. CARDIOVASCULAR:  Regular rate and rhythm without murmurs, rubs or gallops. BREASTS:  Revealed implants.  No masses. ABDOMEN:  Soft, flat, nontender.  There is a well-healed umbilical scar and a lower transverse scar from her tummy tuck. PELVIC:  Vulva, vagina, vaginal cuff normal.  Bimanual otherwise negative.  Slightly tender on the right.  No definite mass noted. EXTREMITIES AND NEUROLOGIC:  Unremarkable.  IMPRESSION:   1.  Chronic pelvic pain, suspect right adnexal adhesive disease. 2.  History of prior abdominal surgery as noted above.  PLAN:  Laparotomy with RSO.  Procedure and risks reviewed as noted above.  VN/NUANCE  D:02/27/2020 T:02/27/2020 JOB:[id number]

## 2020-03-10 NOTE — Progress Notes (Signed)
Spoke with Mry scheudled dr Marcelle Overlie, pt is to reschedule for October pt mother is ill.

## 2020-05-18 ENCOUNTER — Encounter (HOSPITAL_BASED_OUTPATIENT_CLINIC_OR_DEPARTMENT_OTHER): Payer: Self-pay | Admitting: Obstetrics and Gynecology

## 2020-05-18 ENCOUNTER — Other Ambulatory Visit: Payer: Self-pay

## 2020-05-18 NOTE — Progress Notes (Signed)
Left message with dr Perrin Maltese scheduled, pt has not called back to get covid test or do history and we have left multiple messages

## 2020-05-18 NOTE — Progress Notes (Signed)
Spoke w/ via phone for pre-op interview---pt Lab needs dos----  Cbc type and screen             Lab results------none COVID test ------05-19-2020 900 Arrive at -------530 am 05-21-2020 NPO after MN NO Solid Food.  Clear liquids from MN until---430 am then npo Medications to take morning of surgery -----methadone, diazepam Diabetic medication -----n/a Patient Special Instructions -----none Pre-Op special Istructions -----none Patient verbalized understanding of instructions that were given at this phone interview. Patient denies shortness of breath, chest pain, fever, cough at this phone interview.

## 2020-05-19 ENCOUNTER — Other Ambulatory Visit (HOSPITAL_COMMUNITY)
Admission: RE | Admit: 2020-05-19 | Discharge: 2020-05-19 | Disposition: A | Discharge status: Home / Self Care | Payer: 59 | Source: Ambulatory Visit | Attending: Obstetrics and Gynecology | Admitting: Obstetrics and Gynecology

## 2020-05-19 DIAGNOSIS — Z01812 Encounter for preprocedural laboratory examination: Secondary | ICD-10-CM | POA: Diagnosis present

## 2020-05-19 DIAGNOSIS — Z20822 Contact with and (suspected) exposure to covid-19: Secondary | ICD-10-CM | POA: Diagnosis not present

## 2020-05-19 LAB — SARS CORONAVIRUS 2 (TAT 6-24 HRS): SARS Coronavirus 2: NEGATIVE

## 2020-05-21 ENCOUNTER — Other Ambulatory Visit: Payer: Self-pay

## 2020-05-21 ENCOUNTER — Observation Stay (HOSPITAL_BASED_OUTPATIENT_CLINIC_OR_DEPARTMENT_OTHER)
Admission: RE | Admit: 2020-05-21 | Discharge: 2020-05-22 | Disposition: A | Discharge status: Home / Self Care | Payer: 59 | Attending: Obstetrics and Gynecology | Admitting: Obstetrics and Gynecology

## 2020-05-21 ENCOUNTER — Encounter (HOSPITAL_BASED_OUTPATIENT_CLINIC_OR_DEPARTMENT_OTHER): Payer: Self-pay | Admitting: Obstetrics and Gynecology

## 2020-05-21 ENCOUNTER — Ambulatory Visit (HOSPITAL_BASED_OUTPATIENT_CLINIC_OR_DEPARTMENT_OTHER): Payer: 59 | Admitting: Anesthesiology

## 2020-05-21 ENCOUNTER — Encounter (HOSPITAL_BASED_OUTPATIENT_CLINIC_OR_DEPARTMENT_OTHER)
Admission: RE | Disposition: A | Discharge status: Home / Self Care | Payer: Self-pay | Source: Home / Self Care | Attending: Obstetrics and Gynecology

## 2020-05-21 DIAGNOSIS — G8929 Other chronic pain: Secondary | ICD-10-CM | POA: Insufficient documentation

## 2020-05-21 DIAGNOSIS — R1031 Right lower quadrant pain: Secondary | ICD-10-CM | POA: Diagnosis present

## 2020-05-21 DIAGNOSIS — F17210 Nicotine dependence, cigarettes, uncomplicated: Secondary | ICD-10-CM | POA: Insufficient documentation

## 2020-05-21 DIAGNOSIS — R102 Pelvic and perineal pain: Secondary | ICD-10-CM | POA: Diagnosis present

## 2020-05-21 DIAGNOSIS — N9089 Other specified noninflammatory disorders of vulva and perineum: Secondary | ICD-10-CM | POA: Insufficient documentation

## 2020-05-21 HISTORY — PX: VULVECTOMY: SHX1086

## 2020-05-21 HISTORY — DX: Right lower quadrant pain: R10.31

## 2020-05-21 HISTORY — DX: Deficiency of other specified B group vitamins: E53.8

## 2020-05-21 HISTORY — PX: LAPAROTOMY: SHX154

## 2020-05-21 LAB — CBC
HCT: 43.6 % (ref 36.0–46.0)
Hemoglobin: 14.1 g/dL (ref 12.0–15.0)
MCH: 32.1 pg (ref 26.0–34.0)
MCHC: 32.3 g/dL (ref 30.0–36.0)
MCV: 99.3 fL (ref 80.0–100.0)
Platelets: 220 10*3/uL (ref 150–400)
RBC: 4.39 MIL/uL (ref 3.87–5.11)
RDW: 13.3 % (ref 11.5–15.5)
WBC: 9.1 10*3/uL (ref 4.0–10.5)
nRBC: 0 % (ref 0.0–0.2)

## 2020-05-21 LAB — TYPE AND SCREEN
ABO/RH(D): A POS
Antibody Screen: NEGATIVE

## 2020-05-21 SURGERY — LAPAROTOMY
Anesthesia: General | Site: Vulva | Laterality: Right

## 2020-05-21 MED ORDER — MIDAZOLAM HCL 5 MG/5ML IJ SOLN
INTRAMUSCULAR | Status: DC | PRN
  Administered 2020-05-21: 2 mg via INTRAVENOUS

## 2020-05-21 MED ORDER — METHADONE HCL 10 MG PO TABS
20.0000 mg | ORAL_TABLET | Freq: Two times a day (BID) | ORAL | Status: DC
  Administered 2020-05-21 – 2020-05-22 (×2): 20 mg via ORAL
  Filled 2020-05-21 (×2): qty 2

## 2020-05-21 MED ORDER — BUPIVACAINE LIPOSOME 1.3 % IJ SUSP
INTRAMUSCULAR | Status: DC | PRN
  Administered 2020-05-21: 20 mL

## 2020-05-21 MED ORDER — DEXMEDETOMIDINE HCL 200 MCG/2ML IV SOLN
INTRAVENOUS | Status: DC | PRN
  Administered 2020-05-21: 4 ug via INTRAVENOUS
  Administered 2020-05-21 (×2): 8 ug via INTRAVENOUS

## 2020-05-21 MED ORDER — DIAZEPAM 5 MG PO TABS
ORAL_TABLET | ORAL | Status: AC
  Filled 2020-05-21: qty 2

## 2020-05-21 MED ORDER — HYDROMORPHONE HCL 1 MG/ML IJ SOLN
0.2500 mg | INTRAMUSCULAR | Status: DC | PRN
  Administered 2020-05-21 (×3): 0.25 mg via INTRAVENOUS
  Administered 2020-05-21: 0.5 mg via INTRAVENOUS
  Administered 2020-05-21: 0.25 mg via INTRAVENOUS
  Administered 2020-05-21: 0.5 mg via INTRAVENOUS

## 2020-05-21 MED ORDER — LIDOCAINE HCL (CARDIAC) PF 100 MG/5ML IV SOSY
PREFILLED_SYRINGE | INTRAVENOUS | Status: DC | PRN
  Administered 2020-05-21: 100 mg via INTRAVENOUS

## 2020-05-21 MED ORDER — FENTANYL CITRATE (PF) 100 MCG/2ML IJ SOLN
INTRAMUSCULAR | Status: AC
Start: 2020-05-21 — End: ?
  Filled 2020-05-21: qty 2

## 2020-05-21 MED ORDER — 0.9 % SODIUM CHLORIDE (POUR BTL) OPTIME
TOPICAL | Status: DC | PRN
  Administered 2020-05-21: 2000 mL

## 2020-05-21 MED ORDER — DEXAMETHASONE SODIUM PHOSPHATE 10 MG/ML IJ SOLN
INTRAMUSCULAR | Status: AC
  Filled 2020-05-21: qty 1

## 2020-05-21 MED ORDER — LABETALOL HCL 5 MG/ML IV SOLN
5.0000 mg | INTRAVENOUS | Status: DC | PRN
  Administered 2020-05-21: 5 mg via INTRAVENOUS

## 2020-05-21 MED ORDER — MORPHINE SULFATE 2 MG/ML IV SOLN
INTRAVENOUS | Status: DC
  Filled 2020-05-21: qty 25

## 2020-05-21 MED ORDER — DIAZEPAM 5 MG PO TABS
10.0000 mg | ORAL_TABLET | Freq: Three times a day (TID) | ORAL | Status: DC | PRN
  Administered 2020-05-21 (×2): 10 mg via ORAL

## 2020-05-21 MED ORDER — DEXTROSE IN LACTATED RINGERS 5 % IV SOLN
INTRAVENOUS | Status: DC
  Administered 2020-05-21: 1 mL via INTRAVENOUS

## 2020-05-21 MED ORDER — MEPERIDINE HCL 25 MG/ML IJ SOLN: 6.2500 mg | INTRAMUSCULAR | Status: DC | PRN

## 2020-05-21 MED ORDER — ONDANSETRON HCL 4 MG/2ML IJ SOLN: 4.0000 mg | Freq: Four times a day (QID) | INTRAMUSCULAR | Status: DC | PRN

## 2020-05-21 MED ORDER — LACTATED RINGERS IV SOLN: INTRAVENOUS | Status: DC

## 2020-05-21 MED ORDER — MIDAZOLAM HCL 2 MG/2ML IJ SOLN
INTRAMUSCULAR | Status: AC
  Filled 2020-05-21: qty 2

## 2020-05-21 MED ORDER — LIDOCAINE 2% (20 MG/ML) 5 ML SYRINGE
INTRAMUSCULAR | Status: AC
  Filled 2020-05-21: qty 5

## 2020-05-21 MED ORDER — PROPOFOL 10 MG/ML IV BOLUS
INTRAVENOUS | Status: DC | PRN
  Administered 2020-05-21: 150 mg via INTRAVENOUS
  Administered 2020-05-21: 50 mg via INTRAVENOUS

## 2020-05-21 MED ORDER — DEXMEDETOMIDINE (PRECEDEX) IN NS 20 MCG/5ML (4 MCG/ML) IV SYRINGE
PREFILLED_SYRINGE | INTRAVENOUS | Status: AC
  Filled 2020-05-21: qty 5

## 2020-05-21 MED ORDER — SODIUM CHLORIDE 0.9% FLUSH: 9.0000 mL | INTRAVENOUS | Status: DC | PRN

## 2020-05-21 MED ORDER — POVIDONE-IODINE 10 % EX SWAB
2.0000 "application " | Freq: Once | CUTANEOUS | Status: AC
  Administered 2020-05-21: 2 via TOPICAL

## 2020-05-21 MED ORDER — FENTANYL CITRATE (PF) 100 MCG/2ML IJ SOLN
INTRAMUSCULAR | Status: DC | PRN
  Administered 2020-05-21: 50 ug via INTRAVENOUS
  Administered 2020-05-21 (×2): 100 ug via INTRAVENOUS
  Administered 2020-05-21: 50 ug via INTRAVENOUS

## 2020-05-21 MED ORDER — PROPOFOL 10 MG/ML IV BOLUS
INTRAVENOUS | Status: AC
  Filled 2020-05-21: qty 20

## 2020-05-21 MED ORDER — SODIUM CHLORIDE (PF) 0.9 % IJ SOLN
INTRAMUSCULAR | Status: DC | PRN
  Administered 2020-05-21: 20 mL

## 2020-05-21 MED ORDER — METHADONE HCL 10 MG PO TABS
10.0000 mg | ORAL_TABLET | Freq: Two times a day (BID) | ORAL | Status: DC
  Filled 2020-05-21 (×2): qty 1

## 2020-05-21 MED ORDER — ROCURONIUM BROMIDE 100 MG/10ML IV SOLN
INTRAVENOUS | Status: DC | PRN
  Administered 2020-05-21: 10 mg via INTRAVENOUS

## 2020-05-21 MED ORDER — DIAZEPAM 5 MG PO TABS
ORAL_TABLET | ORAL | Status: AC
  Filled 2020-05-21: qty 1

## 2020-05-21 MED ORDER — MENTHOL 3 MG MT LOZG: 1.0000 | LOZENGE | OROMUCOSAL | Status: DC | PRN

## 2020-05-21 MED ORDER — NALOXONE HCL 0.4 MG/ML IJ SOLN: 0.4000 mg | INTRAMUSCULAR | Status: DC | PRN

## 2020-05-21 MED ORDER — ROCURONIUM BROMIDE 10 MG/ML (PF) SYRINGE
PREFILLED_SYRINGE | INTRAVENOUS | Status: AC
  Filled 2020-05-21: qty 10

## 2020-05-21 MED ORDER — FENTANYL CITRATE (PF) 100 MCG/2ML IJ SOLN
INTRAMUSCULAR | Status: AC
  Filled 2020-05-21: qty 2

## 2020-05-21 MED ORDER — FENTANYL 12 MCG/HR TD PT72
1.0000 | MEDICATED_PATCH | TRANSDERMAL | Status: DC
  Administered 2020-05-21: 1 via TRANSDERMAL
  Filled 2020-05-21: qty 1

## 2020-05-21 MED ORDER — HYDROMORPHONE HCL 1 MG/ML IJ SOLN
INTRAMUSCULAR | Status: AC
  Filled 2020-05-21: qty 1

## 2020-05-21 MED ORDER — SODIUM CHLORIDE 0.9 % IV SOLN
2.0000 g | INTRAVENOUS | Status: AC
  Administered 2020-05-21: 2 g via INTRAVENOUS

## 2020-05-21 MED ORDER — ONDANSETRON HCL 4 MG/2ML IJ SOLN
INTRAMUSCULAR | Status: DC | PRN
  Administered 2020-05-21: 4 mg via INTRAVENOUS

## 2020-05-21 MED ORDER — SODIUM CHLORIDE 0.9 % IV SOLN
INTRAVENOUS | Status: AC
  Filled 2020-05-21: qty 2

## 2020-05-21 MED ORDER — LABETALOL HCL 5 MG/ML IV SOLN
INTRAVENOUS | Status: AC
  Filled 2020-05-21: qty 4

## 2020-05-21 MED ORDER — ONDANSETRON HCL 4 MG/2ML IJ SOLN: 4.0000 mg | Freq: Once | INTRAMUSCULAR | Status: DC | PRN

## 2020-05-21 MED ORDER — ONDANSETRON HCL 4 MG/2ML IJ SOLN
INTRAMUSCULAR | Status: AC
  Filled 2020-05-21: qty 2

## 2020-05-21 MED ORDER — SUGAMMADEX SODIUM 200 MG/2ML IV SOLN
INTRAVENOUS | Status: DC | PRN
  Administered 2020-05-21: 200 mg via INTRAVENOUS

## 2020-05-21 MED ORDER — BUPIVACAINE HCL (PF) 0.25 % IJ SOLN
INTRAMUSCULAR | Status: DC | PRN
  Administered 2020-05-21: 20 mL

## 2020-05-21 MED ORDER — METOCLOPRAMIDE HCL 10 MG PO TABS
10.0000 mg | ORAL_TABLET | Freq: Four times a day (QID) | ORAL | Status: DC | PRN
  Filled 2020-05-21: qty 1

## 2020-05-21 MED ORDER — ONDANSETRON HCL 4 MG PO TABS: 4.0000 mg | ORAL_TABLET | Freq: Four times a day (QID) | ORAL | Status: DC | PRN

## 2020-05-21 SURGICAL SUPPLY — 59 items
APL SKNCLS STERI-STRIP NONHPOA (GAUZE/BANDAGES/DRESSINGS)
BAG DRN RND TRDRP ANRFLXCHMBR (UROLOGICAL SUPPLIES) ×2
BAG URINE DRAIN 2000ML AR STRL (UROLOGICAL SUPPLIES) ×3 IMPLANT
BARRIER ADHS 3X4 INTERCEED (GAUZE/BANDAGES/DRESSINGS) IMPLANT
BENZOIN TINCTURE PRP APPL 2/3 (GAUZE/BANDAGES/DRESSINGS) IMPLANT
BLADE EXTENDED COATED 6.5IN (ELECTRODE) IMPLANT
BLADE HEX COATED 2.75 (ELECTRODE) ×3 IMPLANT
BLADE SURG 10 STRL SS (BLADE) ×6 IMPLANT
BRR ADH 4X3 ABS CNTRL BYND (GAUZE/BANDAGES/DRESSINGS)
CANISTER SUCT 3000ML PPV (MISCELLANEOUS) ×3 IMPLANT
CATH FOLEY 2WAY SLVR  5CC 14FR (CATHETERS) ×3
CATH FOLEY 2WAY SLVR 5CC 14FR (CATHETERS) ×2 IMPLANT
CELLS DAT CNTRL 66122 CELL SVR (MISCELLANEOUS) ×2 IMPLANT
CNTNR URN SCR LID CUP LEK RST (MISCELLANEOUS) IMPLANT
CONT SPEC 4OZ STRL OR WHT (MISCELLANEOUS) ×6
COVER WAND RF STERILE (DRAPES) ×3 IMPLANT
DECANTER SPIKE VIAL GLASS SM (MISCELLANEOUS) IMPLANT
DRAPE HYSTEROSCOPY (MISCELLANEOUS) ×1 IMPLANT
DRAPE LAPAROTOMY T 102X78X121 (DRAPES) ×3 IMPLANT
DRAPE WARM FLUID 44X44 (DRAPES) ×3 IMPLANT
DRSG OPSITE POSTOP 4X10 (GAUZE/BANDAGES/DRESSINGS) ×1 IMPLANT
DRSG TELFA 3X8 NADH (GAUZE/BANDAGES/DRESSINGS) ×3 IMPLANT
DURAPREP 26ML APPLICATOR (WOUND CARE) ×3 IMPLANT
GAUZE SPONGE 4X4 12PLY STRL (GAUZE/BANDAGES/DRESSINGS) ×6 IMPLANT
GLOVE BIO SURGEON STRL SZ7 (GLOVE) ×6 IMPLANT
GOWN STRL REUS W/ TWL LRG LVL3 (GOWN DISPOSABLE) ×6 IMPLANT
GOWN STRL REUS W/TWL LRG LVL3 (GOWN DISPOSABLE) ×9
KIT TURNOVER CYSTO (KITS) ×3 IMPLANT
NDL HYPO 25X1 1.5 SAFETY (NEEDLE) IMPLANT
NDL SAFETY ECLIPSE 18X1.5 (NEEDLE) IMPLANT
NEEDLE HYPO 18GX1.5 SHARP (NEEDLE)
NEEDLE HYPO 22GX1.5 SAFETY (NEEDLE) IMPLANT
NEEDLE HYPO 25X1 1.5 SAFETY (NEEDLE) IMPLANT
NS IRRIG 1000ML POUR BTL (IV SOLUTION) ×6 IMPLANT
NS IRRIG 500ML POUR BTL (IV SOLUTION) ×3 IMPLANT
PACK ABDOMINAL GYN (CUSTOM PROCEDURE TRAY) ×3 IMPLANT
PAD DRESSING TELFA 3X8 NADH (GAUZE/BANDAGES/DRESSINGS) ×2 IMPLANT
PAD OB MATERNITY 4.3X12.25 (PERSONAL CARE ITEMS) ×3 IMPLANT
RETRACTOR WND ALEXIS 18 MED (MISCELLANEOUS) IMPLANT
RTRCTR WOUND ALEXIS 18CM MED (MISCELLANEOUS) ×3
SHEET LAVH (DRAPES) ×1 IMPLANT
SPONGE LAP 18X18 RF (DISPOSABLE) ×3 IMPLANT
STAPLER VISISTAT 35W (STAPLE) ×3 IMPLANT
STRIP CLOSURE SKIN 1/4X4 (GAUZE/BANDAGES/DRESSINGS) ×3 IMPLANT
SUT CHROMIC 0 CT 1 (SUTURE) ×3 IMPLANT
SUT MNCRL AB 4-0 PS2 18 (SUTURE) ×3 IMPLANT
SUT PDS AB 0 CT1 27 (SUTURE) ×6 IMPLANT
SUT VIC AB 0 CT1 18XCR BRD8 (SUTURE) ×2 IMPLANT
SUT VIC AB 0 CT1 8-18 (SUTURE) ×3
SUT VIC AB 3-0 CT1 27 (SUTURE) ×3
SUT VIC AB 3-0 CT1 TAPERPNT 27 (SUTURE) ×2 IMPLANT
SUT VIC AB 4-0 PS2 27 (SUTURE) ×1 IMPLANT
SUT VICRYL 0 TIES 12 18 (SUTURE) ×3 IMPLANT
SUT VICRYL RAPIDE 3 0 (SUTURE) ×1 IMPLANT
SUT VICRYL RAPIDE 4/0 PS 2 (SUTURE) ×1 IMPLANT
SYR BULB IRRIG 60ML STRL (SYRINGE) ×3 IMPLANT
SYR CONTROL 10ML LL (SYRINGE) IMPLANT
TOWEL OR 17X26 10 PK STRL BLUE (TOWEL DISPOSABLE) ×6 IMPLANT
WATER STERILE IRR 1000ML POUR (IV SOLUTION) ×3 IMPLANT

## 2020-05-21 NOTE — Progress Notes (Signed)
The patient was re-examined with no change in status 

## 2020-05-21 NOTE — Progress Notes (Signed)
L labial lesions marked yesterday and added to consent for excision

## 2020-05-21 NOTE — Op Note (Signed)
Preop diagnosis: Chronic right lower quadrant pain, left vulvar skin lesion.  Postoperative diagnosis: Same  Procedure: Laparotomy with RSO, excision of left labial skin lesion.  Surgeon: Marcelle Overlie  Assistant: [place]  EBL: 75 cc  Procedure and findings:  Patient was taken to the operating room after appropriate timeouts were taken, once general anesthesia was obtained, the abdomen prepped and draped, Foley catheter was position, the vulvar area was prepped separately also.  Legs in stirrups.  Transverse Pfannenstiel incision was made 2 fingerbreadths above the symphysis and below the tummy tuck incision carried down to the fascia which was incised and extended transversely.  Rectus muscles divided in the midline, peritoneum entered superiorly without incident and extended vertically.  Palpation the right ovary could be freed up along with tube was not adherent to the sidewall but there was a small hemorrhagic cyst noted at one pole.  No other abnormalities were noted.  She had a prior LSO and hysterectomy.  [name] retractor was positioned, the right tube and ovary were grasped with a Babcock clamp, the course of the right ureter was well below the operative site.  Heaney clamps placed close to the tube and ovary over the somewhat attenuated right IP ligament.  The right tube and ovary were thus excised, free tie followed by ligature 0 Vicryl.  This area was hemostatic.  Prior to closure sponge needle instrument counts reported as correct x2.  Peritoneum closed with a 2-0 Vicryl running suture the same to close the rectus muscles in the midline.  Fascia closed laterally to midline with a 0 PDS suture.  Diluted Exparel solution with quarter percent Marcaine and saline was then injected subfascially and also subcutaneously for postoperative analgesia.  Subcu fat was fairly thin was not closed separately this was hemostatic 4-0 Monocryl subcuticular skin closure with appropriate dressing.  Attention  directed to the vulvar area that had been previously marked.  Legs were extended this area was previously prepped, skin lesion was excised in a ellipse.  Bovie for cautery 3-0 Vicryl repeat sutures used to reapproximate the deeper tissue, 4-0 Monocryl and 3-0 Vicryl repeat sutures used to reapproximate the skin with good hemostasis.  She tolerated this well went to recovery room in good condition.  Dictated with Dragon Medical 1  Shelly Pica MD

## 2020-05-21 NOTE — Transfer of Care (Signed)
Immediate Anesthesia Transfer of Care Note  Patient: [name]  Procedure(s) Performed: Procedure(s) (LRB): LAPAROTOMY, RIGHT SALPINGO-OOPHORECTOMY (Right) EXCISION OF LEFT VULVAR SKIN LESION (Left)  Patient Location: PACU  Anesthesia Type: General  Level of Consciousness: awake, sedated, patient cooperative and responds to stimulation  Airway & Oxygen Therapy: Patient Spontanous Breathing and Patient connected to Bern 02 and soft FM   Post-op Assessment: Report given to PACU RN, Post -op Vital signs reviewed and stable and Patient moving all extremities  Post vital signs: Reviewed and stable  Complications: No apparent anesthesia complications

## 2020-05-21 NOTE — Progress Notes (Signed)
Received via stretcher from PACU, moved to bed with minimal assistance, C/O discomfort of foley. Upon settling she went immediately to sleep. States pain is at level 3-4.  Awakened to have her deep breathe and cough.

## 2020-05-21 NOTE — Anesthesia Preprocedure Evaluation (Signed)
Anesthesia Evaluation  Patient identified by MRN, date of birth, ID band Patient awake    Reviewed: Allergy & Precautions, NPO status , Patient's Chart, lab work & pertinent test results  History of Anesthesia Complications (+) PONV  Airway Mallampati: I  TM Distance: >3 FB Neck ROM: Full    Dental   Pulmonary Patient abstained from smoking., former smoker,    Pulmonary exam normal        Cardiovascular Normal cardiovascular exam     Neuro/Psych    GI/Hepatic GERD  Medicated and Controlled,  Endo/Other    Renal/GU      Musculoskeletal   Abdominal   Peds  Hematology   Anesthesia Other Findings   Reproductive/Obstetrics                             Anesthesia Physical Anesthesia Plan  ASA: II  Anesthesia Plan: General   Post-op Pain Management:    Induction: Intravenous  PONV Risk Score and Plan: 4 or greater and Midazolam, Ondansetron, Dexamethasone and Scopolamine patch - Pre-op  Airway Management Planned: Oral ETT  Additional Equipment:   Intra-op Plan:   Post-operative Plan: Extubation in OR  Informed Consent: I have reviewed the patients History and Physical, chart, labs and discussed the procedure including the risks, benefits and alternatives for the proposed anesthesia with the patient or authorized representative who has indicated his/her understanding and acceptance.       Plan Discussed with: CRNA and Surgeon  Anesthesia Plan Comments:         Anesthesia Quick Evaluation

## 2020-05-21 NOTE — H&P (Signed)
54 yo PMP pt with chronic pelvic pain, prior hx LAVH + LSO, presents for RSO>>>hx tummy tuck and umb hernia repair with mesh>>Also, she has additional complaints of 2. Excise 2 L labial lesions  PE/ VSS afeb  CV RRR w/o m, r, g Lungs clear abd tummy tuck incision Pelvic>vaginal discharge /bimanual neg  Imp Chronic pelvic pain Plan laparotomy with RSO>.excision labial lesions
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
## 2020-05-21 NOTE — Anesthesia Procedure Notes (Signed)
Procedure Name: Intubation Date/Time: 05/21/2020 7:46 AM Performed by: Justice Rocher, CRNA Pre-anesthesia Checklist: Patient identified, Emergency Drugs available, Suction available, Patient being monitored and Timeout performed Patient Re-evaluated:Patient Re-evaluated prior to induction Oxygen Delivery Method: Circle system utilized Preoxygenation: Pre-oxygenation with 100% oxygen Induction Type: IV induction Ventilation: Mask ventilation without difficulty Laryngoscope Size: Mac and 3 Grade View: Grade II Tube type: Oral Tube size: 7.0 mm Number of attempts: 1 Airway Equipment and Method: Stylet and Oral airway Placement Confirmation: ETT inserted through vocal cords under direct vision,  positive ETCO2,  breath sounds checked- equal and bilateral and CO2 detector Secured at: 21 (lip ) cm Tube secured with: Tape Dental Injury: Teeth and Oropharynx as per pre-operative assessment

## 2020-05-21 NOTE — Anesthesia Postprocedure Evaluation (Signed)
Anesthesia Post Note  Patient: [name]  Procedure(s) Performed: LAPAROTOMY, RIGHT SALPINGO-OOPHORECTOMY (Right Abdomen) EXCISION OF LEFT VULVAR SKIN LESION (Left Vulva)     Patient location during evaluation: PACU Anesthesia Type: General Level of consciousness: awake and alert Pain management: pain level controlled Vital Signs Assessment: post-procedure vital signs reviewed and stable Respiratory status: spontaneous breathing, nonlabored ventilation, respiratory function stable and patient connected to nasal cannula oxygen Cardiovascular status: blood pressure returned to baseline and stable Postop Assessment: no apparent nausea or vomiting Anesthetic complications: no   No complications documented.  Last Vitals:  Vitals:   05/21/20 1100 05/21/20 1130  BP: (!) (P) 163/92   Pulse: (P) 89   Resp: (P) 14 (!) 8  Temp: (!) (P) 36.2 C   SpO2: (P) 99% 99%    Last Pain:  Vitals:   05/21/20 1029  TempSrc:   PainSc: Asleep                 [name] DAVID

## 2020-05-21 NOTE — Progress Notes (Signed)
Patient and spouse inconsolable and demanding that foley be removed, PCA instructions given in detail and encouraged to use, patient is so sleepy but agitated and C/O having to breathe deeply to keep CO2 within range. Moving about in bed, crying, and demanding changes be made. Calming attempts made and assured that MD would be contacted. Spoke with Dr. Marcelle Overlie by phone and received orders,

## 2020-05-22 ENCOUNTER — Encounter (HOSPITAL_BASED_OUTPATIENT_CLINIC_OR_DEPARTMENT_OTHER): Payer: Self-pay | Admitting: Obstetrics and Gynecology

## 2020-05-22 DIAGNOSIS — R1031 Right lower quadrant pain: Secondary | ICD-10-CM | POA: Diagnosis not present

## 2020-05-22 LAB — CBC
HCT: 35.6 % — ABNORMAL LOW (ref 36.0–46.0)
Hemoglobin: 11.5 g/dL — ABNORMAL LOW (ref 12.0–15.0)
MCH: 32.2 pg (ref 26.0–34.0)
MCHC: 32.3 g/dL (ref 30.0–36.0)
MCV: 99.7 fL (ref 80.0–100.0)
Platelets: 212 10*3/uL (ref 150–400)
RBC: 3.57 MIL/uL — ABNORMAL LOW (ref 3.87–5.11)
RDW: 13.2 % (ref 11.5–15.5)
WBC: 12.7 10*3/uL — ABNORMAL HIGH (ref 4.0–10.5)
nRBC: 0 % (ref 0.0–0.2)

## 2020-05-22 LAB — SURGICAL PATHOLOGY

## 2020-05-22 MED ORDER — FENTANYL 12 MCG/HR TD PT72
1.0000 | MEDICATED_PATCH | TRANSDERMAL | 0 refills | Status: DC
Start: 2020-05-24 — End: 2023-05-18

## 2020-05-22 NOTE — Discharge Summary (Signed)
Physician Discharge Summary  Patient ID: [name] MRN: [id number] DOB/AGE: February 03, 1966 54 y.o.  Admit date: 05/21/2020 Discharge date: 05/22/2020  Admission Diagnoses:chronic RLQ pain  Discharge Diagnoses: same Active Problems:   Pelvic pain in female   Pelvic pain   Chronic pain   Discharged Condition: good  Hospital Course: adm for EL>>RSO + excision vulvar lesion. On POD 1, was afeb, ambulating , tol PO and ready for D/C  Consults:SW  Significant Diagnostic Studies: labs:  Results for orders placed or performed during the hospital encounter of 05/21/20 (from the past 24 hour(s))  CBC     Status: Abnormal   Collection Time: 05/22/20  7:15 AM  Result Value Ref Range   WBC 12.7 (H) 4.0 - 10.5 K/uL   RBC 3.57 (L) 3.87 - 5.11 MIL/uL   Hemoglobin 11.5 (L) 12.0 - 15.0 g/dL   HCT 76.1 (L) 36 - 46 %   MCV 99.7 80.0 - 100.0 fL   MCH 32.2 26.0 - 34.0 pg   MCHC 32.3 30.0 - 36.0 g/dL   RDW 60.7 37.1 - 06.2 %   Platelets 212 150 - 400 K/uL   nRBC 0.0 0.0 - 0.2 %    Treatments: surgery: RSO , excision vulvar lesion  Discharge Exam: Blood pressure 100/66, pulse 82, temperature 97.8 F (36.6 C), resp. rate 10, height 5\' 2"  (1.575 m), weight 88.8 kg, SpO2 97 %. abd soft + BS>>>dressing dry  Disposition: Discharge disposition: 01-Home or Self Care        Allergies as of 05/22/2020      Reactions   Codeine    Anxiety, n/v   Duloxetine    cymbalta nausea   Nsaids    Interferes with meds taken   Sumatriptan Succinate    nausea   Corticosteroids Nausea Only, Rash, Other (See Comments)   REACTION: FACIAL RASH ,FEVER ,DIZZINESS, NAUSEA, LOSS OF SMELL AND TASTE      Medication List    TAKE these medications   cyanocobalamin 1000 MCG/ML injection Commonly known as: (VITAMIN B-12) Does once a week as of 05-18-2020   cyclobenzaprine 5 MG tablet Commonly known as: FLEXERIL Take 5 mg by mouth 3 (three) times daily as needed.   diazepam 10 MG tablet Commonly  known as: VALIUM Take 10 mg by mouth 3 (three) times daily as needed.   fentaNYL 12 MCG/HR Commonly known as: DURAGESIC Place 1 patch onto the skin every 3 (three) days. Start taking on: May 24, 2020   hyoscyamine 0.125 MG SL tablet Commonly known as: LEVSIN SL Place under the tongue every 4 (four) hours as needed.   methadone 10 MG tablet Commonly known as: DOLOPHINE SMARTSIG:2 Tablet(s) By Mouth Every 12 Hours   metoCLOPramide 10 MG tablet Commonly known as: REGLAN Take 10 mg by mouth every 6 (six) hours as needed.   promethazine 25 MG tablet Commonly known as: PHENERGAN Take 1 tablet (25 mg total) by mouth every 6 (six) hours as needed for nausea or vomiting.   sucralfate 1 g tablet Commonly known as: Carafate Take 1 tablet (1 g total) by mouth 4 (four) times daily -  with meals and at bedtime.       Follow-up Information    May 26, 2020, MD. Schedule an appointment as soon as possible for a visit in 1 week(s).   Specialty: Obstetrics and Gynecology Contact information: [address] [place] [phone]  Signed: Meriel Pica 05/22/2020, 8:39 AM
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
## 2020-05-24 ENCOUNTER — Telehealth (HOSPITAL_COMMUNITY): Payer: Self-pay | Admitting: Pharmacist

## 2020-05-24 NOTE — Progress Notes (Deleted)
[facility] Inpatient pharmacy:  I received call from patient's spouse at 15:49. He explained that his wife had recent surgery at day surgery center, stayed overnight at [facility] and discharged on Friday 05/22/20.  They were told discharge prescription sent to [facility] for Fentanyl 12 mg topical patch.  [facility] reports to caller that they have not received a prescription. He has called the after hours  nurse at Dr. Dennie Bible office, who I believe he said they essentially said there was nothing they could do.  Either the  after hours nurse or  [facility] pharmacist instructed him to call [facility] hospital pharmacy.   I looked up patient's discharge and PTA medication information. Discharge summary note indicates plan to  discharge on Fentanyl patch 12mg  to start 05/24/20 however I do not see a discharge order for this.  Also outpatient pharmacy listed as 05/26/20 Drug.  I told the caller, Mr. Swanner that I did not see the prescription in discharge orders but that MD did note this medication in his discharge summary note. Possibly the Rx was sent to [facility]. He states this pharmacy is closed on the weekend.  I instructed him to call [facility] after hours nurse/MD on call again for a prescription as I am unable to provide this.   Dennie Bible, RPh Clinical Pharmacist 05/24/2020 10:59 PM

## 2021-10-16 ENCOUNTER — Other Ambulatory Visit: Payer: Self-pay

## 2021-10-16 ENCOUNTER — Ambulatory Visit
Admission: RE | Admit: 2021-10-16 | Discharge: 2021-10-16 | Disposition: A | Discharge status: Home / Self Care | Payer: 59 | Source: Ambulatory Visit

## 2021-10-16 ENCOUNTER — Ambulatory Visit (INDEPENDENT_AMBULATORY_CARE_PROVIDER_SITE_OTHER): Payer: 59

## 2021-10-16 VITALS — BP 130/87 | HR 80 | Temp 97.5°F | Resp 16

## 2021-10-16 DIAGNOSIS — M79605 Pain in left leg: Secondary | ICD-10-CM | POA: Diagnosis not present

## 2021-10-16 DIAGNOSIS — W19XXXA Unspecified fall, initial encounter: Secondary | ICD-10-CM | POA: Diagnosis not present

## 2021-10-16 DIAGNOSIS — M25552 Pain in left hip: Secondary | ICD-10-CM | POA: Diagnosis not present

## 2021-10-16 NOTE — ED Provider Notes (Signed)
[facility]    CSN: [id number] Arrival date & time: 10/16/21  1407      History   Chief Complaint Chief Complaint  Patient presents with   Hip Pain    Left hip     HPI [name] is a 56 y.o. female.   Patient presents today due to left hip and left lower leg pain that started 1 week ago after a fall.  Patient reports that they removed the steps from her house due to some construction work that was being completed and she was unaware.  Patient tried to step down and fell landing on her left side.  Patient is able to bear weight.  Denies any numbness or tingling.  Patient has been taking methadone and muscle relaxer which she is prescribed already with minimal improvement in pain.  Patient does not take blood thinners.  Denies hitting head or losing consciousness during fall.   Hip Pain   Past Medical History:  Diagnosis Date   Anxiety    Arthritis    Chronic back pain    had n/v and  fainting after procedure. N/v continues   Chronic pain    Cysts of both ovaries    Depression    GERD (gastroesophageal reflux disease)    PONV (postoperative nausea and vomiting)    takes iv med for nausea   Right lower quadrant pain    Vitamin B 12 deficiency     Patient Active Problem List   Diagnosis Date Noted   Pelvic pain in female 05/21/2020   Pelvic pain 05/21/2020   Chronic pain 05/21/2020   Malaise and fatigue 03/19/2019   Daytime somnolence 10/26/2017   Ventral hernia without obstruction or gangrene 08/09/2017   Chronic bilateral low back pain without sciatica 10/31/2015   Foot pain, bilateral 10/31/2015   Left shoulder pain 10/31/2015   Pain, upper back 10/31/2015   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   Acute cholecystitis 09/07/2015   Arthralgia of multiple joints 09/05/2015   Bursitis 09/05/2015   Chronic insomnia 09/05/2015   Constipation 09/05/2015   Depression 09/05/2015   Generalized anxiety disorder 09/05/2015   Heartburn 09/05/2015    Vitamin D deficiency 09/05/2015   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   Abnormal weight gain 08/18/2015   Obesity BMI 31 05/17/2013    Past Surgical History:  Procedure Laterality Date   ABDOMINAL HYSTERECTOMY     BARTHOLIN GLAND CYST EXCISION     CERVICAL FUSION  20111 or 2012   plates and screws in neck   CHOLECYSTECTOMY N/A 09/08/2015   Procedure: LAPAROSCOPIC CHOLECYSTECTOMY WITH INTRAOPERATIVE CHOLANGIOGRAM;  Surgeon: Autumn Messing III, MD;  Location: [facility];  Service: General;  Laterality: N/A;   HERNIA REPAIR     1 abdominal hernia , i umbilical    HIATAL HERNIA REPAIR     INSERTION OF MESH N/A 08/11/2017   Procedure: INSERTION OF MESH;  Surgeon: Ralene Ok, MD;  Location: [place];  Service: General;  Laterality: N/A;   LAPAROTOMY Right 05/21/2020   Procedure: LAPAROTOMY, RIGHT SALPINGO-OOPHORECTOMY;  Surgeon: Molli Posey, MD;  Location: [facility];  Service: Gynecology;  Laterality: Right;   LEFT OOPHORECTOMY     intestine twisted around tube and ovary    UMBILICAL HERNIA REPAIR N/A 08/11/2017   Procedure: LAPAROSCOPIC UMBILICAL HERNIA REPAIR;  Surgeon: Ralene Ok, MD;  Location: [place];  Service: General;  Laterality: N/A;   VULVECTOMY Left 05/21/2020   Procedure: EXCISION OF LEFT VULVAR SKIN LESION;  Surgeon:  Richarda Overlie, MD;  Location: [facility];  Service: Gynecology;  Laterality: Left;    OB History     Gravida  2   Para      Term      Preterm      AB      Living  2      SAB      IAB      Ectopic      Multiple      Live Births               Home Medications    Prior to Admission medications   Medication Sig Start Date End Date Taking? Authorizing Provider  omeprazole (PRILOSEC) 40 MG capsule Take one capsule twice a day for control of stomach acid problems 09/19/21  Yes [provider]  QUEtiapine (SEROQUEL) 50 MG tablet TAKE ONE TABLET AT BEDTIME FOR INSOMNIA 09/19/21  Yes [provider]  cyanocobalamin (,VITAMIN B-12,) 1000 MCG/ML injection Does once a week as of 05-18-2020 08/17/19   [provider]  cyclobenzaprine (FLEXERIL) 5 MG tablet Take 5 mg by mouth 3 (three) times daily as needed.  08/11/19   [provider]  diazepam (VALIUM) 10 MG tablet Take 10 mg by mouth 3 (three) times daily as needed. 08/19/19   [provider]  fentaNYL (DURAGESIC) 12 MCG/HR Place 1 patch onto the skin every 3 (three) days. 05/24/20   Richarda Overlie, MD  hyoscyamine (LEVSIN SL) 0.125 MG SL tablet Place under the tongue every 4 (four) hours as needed.  06/29/19   [provider]  methadone (DOLOPHINE) 10 MG tablet SMARTSIG:2 Tablet(s) By Mouth Every 12 Hours 08/17/19   [provider]  metoCLOPramide (REGLAN) 10 MG tablet Take 10 mg by mouth every 6 (six) hours as needed.  03/19/19   [provider]  promethazine (PHENERGAN) 25 MG tablet Take 1 tablet (25 mg total) by mouth every 6 (six) hours as needed for nausea or vomiting. 07/13/19   Charlynne Pander, MD  sucralfate (CARAFATE) 1 g tablet Take 1 tablet (1 g total) by mouth 4 (four) times daily -  with meals and at bedtime. 07/13/19   Charlynne Pander, MD    Family History Family History  Problem Relation Age of Onset   Diabetes Mother    Heart disease Mother    Heart disease Sister    Heart disease Brother    Thyroid disease Neg Hx     Social History Social History   Tobacco Use   Smoking status: Former    Packs/day: 0.50    Years: 35.00    Pack years: 17.50    Types: Cigarettes    Quit date: 08/09/2013    Years since quitting: 8.1   Smokeless tobacco: Never  Vaping Use   Vaping Use: Never used  Substance Use Topics   Alcohol use: No   Drug use: No     Allergies   Codeine, Duloxetine, Nsaids, Sumatriptan succinate, and Corticosteroids   Review of Systems Review of Systems Per HPI  Physical Exam Triage Vital Signs ED Triage Vitals  Enc Vitals Group      BP 10/16/21 1451 130/87     Pulse Rate 10/16/21 1451 80     Resp 10/16/21 1451 16     Temp 10/16/21 1451 (!) 97.5 F (36.4 C)     Temp Source 10/16/21 1451 Oral     SpO2 10/16/21 1451 95 %  Weight --      Height --      Head Circumference --      Peak Flow --      Pain Score 10/16/21 1459 5     Pain Loc --      Pain Edu? --      Excl. in [place]? --    No data found.  Updated Vital Signs BP 130/87 (BP Location: Left Arm)    Pulse 80    Temp (!) 97.5 F (36.4 C) (Oral)    Resp 16    SpO2 95%   Visual Acuity Right Eye Distance:   Left Eye Distance:   Bilateral Distance:    Right Eye Near:   Left Eye Near:    Bilateral Near:     Physical Exam Exam conducted with a chaperone present.  Constitutional:      General: She is not in acute distress.    Appearance: Normal appearance. She is not toxic-appearing or diaphoretic.  HENT:     Head: Normocephalic and atraumatic.  Eyes:     Extraocular Movements: Extraocular movements intact.     Conjunctiva/sclera: Conjunctivae normal.  Pulmonary:     Effort: Pulmonary effort is normal.  Musculoskeletal:     Comments: Tenderness to palpation to left lateral hip as well as left anterior upper thigh.  No tenderness to palpation to left lower extremity or knee.  Patient is able to stand and bear weight.  Full range of motion of knee.  No abnormalities to ankle or left foot.  Neurovascular intact.  No obvious breathing, erythema, lacerations, abrasions noted.  Neurological:     General: No focal deficit present.     Mental Status: She is alert and oriented to person, place, and time. Mental status is at baseline.  Psychiatric:        Mood and Affect: Mood normal.        Behavior: Behavior normal.        Thought Content: Thought content normal.        Judgment: Judgment normal.     UC Treatments / Results  Labs (all labs ordered are listed, but only abnormal results are displayed) Labs Reviewed - No data to  display  EKG   Radiology DG Tibia/Fibula Left  Result Date: 10/16/2021 CLINICAL DATA:  LEFT hip and leg pain, fell 1 week ago EXAM: LEFT TIBIA AND FIBULA - 2 VIEW COMPARISON:  None FINDINGS: Question mild osseous demineralization versus over penetration on exam. Knee and ankle joint alignments normal. No acute fracture, dislocation, or bone destruction. IMPRESSION: No acute osseous abnormalities. Electronically Signed   By: Lavonia Dana M.D.   On: 10/16/2021 15:37   DG Hip Unilat With Pelvis 2-3 Views Left  Result Date: 10/16/2021 CLINICAL DATA:  Left hip and left leg pain status post fall 1 week ago. EXAM: DG HIP (WITH OR WITHOUT PELVIS) 2-3V LEFT COMPARISON:  None FINDINGS: Limited evaluation due to overlapping osseous structures and overlying soft tissues. There is no evidence of hip fracture or dislocation of the left hip. Frontal view of the right hip grossly unremarkable. No acute displaced fracture or diastasis of the bones of the pelvis. There is no evidence of arthropathy or other focal bone abnormality. IMPRESSION: Negative for acute traumatic injury. Electronically Signed   By: Iven Finn M.D.   On: 10/16/2021 15:34    Procedures Procedures (including critical care time)  Medications Ordered in UC Medications - No data to display  Initial Impression /  Assessment and Plan / UC Course  I have reviewed the triage vital signs and the nursing notes.  Pertinent labs & imaging results that were available during my care of the patient were reviewed by me and considered in my medical decision making (see chart for details).     X-rays are negative for any acute bony abnormality.  Suspect muscular injury or contusion with inflammation due to fall.  Patient has full range of motion and is able to bear weight so do not think that patient is in need of immediate medical attention at the hospital or immediate orthopedic referral at this time.  Discussed supportive care and ice  application.  Patient to follow-up with provided contact information for orthopedist for further evaluation and management if needed.  Patient verbalized understanding and was agreeable with plan. Final Clinical Impressions(s) / UC Diagnoses   Final diagnoses:  Fall  Left hip pain  Left leg pain     Discharge Instructions      X-rays are normal.  Please follow-up with primary care doctor or orthopedist for further evaluation and management if pain persists or worsens.     ED Prescriptions   None    PDMP not reviewed this encounter.   Teodora Medici, [place] 10/16/21 1555

## 2021-10-16 NOTE — Discharge Instructions (Signed)
X-rays are normal.  Please follow-up with primary care doctor or orthopedist for further evaluation and management if pain persists or worsens. ?

## 2021-10-16 NOTE — ED Triage Notes (Signed)
Patient presents to Urgent Care with complaints of left hip pain/left leg pain from a fall x 1 week ago. Treating pain with methadone and muscle relaxer.  ?

## 2022-05-04 ENCOUNTER — Other Ambulatory Visit: Payer: Self-pay | Admitting: Family Medicine

## 2022-05-04 DIAGNOSIS — G8929 Other chronic pain: Secondary | ICD-10-CM

## 2022-06-26 ENCOUNTER — Other Ambulatory Visit: Payer: No Typology Code available for payment source

## 2022-07-24 ENCOUNTER — Other Ambulatory Visit: Payer: No Typology Code available for payment source

## 2022-10-06 ENCOUNTER — Other Ambulatory Visit: Payer: No Typology Code available for payment source

## 2022-10-20 ENCOUNTER — Other Ambulatory Visit: Payer: No Typology Code available for payment source

## 2022-11-02 ENCOUNTER — Other Ambulatory Visit: Payer: Self-pay | Admitting: Family Medicine

## 2022-11-02 DIAGNOSIS — G8929 Other chronic pain: Secondary | ICD-10-CM

## 2022-11-02 DIAGNOSIS — M25569 Pain in unspecified knee: Secondary | ICD-10-CM

## 2022-11-27 ENCOUNTER — Inpatient Hospital Stay: Admission: RE | Admit: 2022-11-27 | Payer: No Typology Code available for payment source | Source: Ambulatory Visit

## 2022-12-03 ENCOUNTER — Ambulatory Visit
Admission: RE | Admit: 2022-12-03 | Discharge: 2022-12-03 | Disposition: A | Discharge status: Home / Self Care | Payer: Self-pay | Source: Ambulatory Visit | Attending: Family Medicine | Admitting: Family Medicine

## 2022-12-03 DIAGNOSIS — G8929 Other chronic pain: Secondary | ICD-10-CM

## 2022-12-03 DIAGNOSIS — M25569 Pain in unspecified knee: Secondary | ICD-10-CM

## 2023-05-18 ENCOUNTER — Emergency Department (HOSPITAL_COMMUNITY)
Admission: EM | Admit: 2023-05-18 | Discharge: 2023-05-18 | Disposition: A | Discharge status: Home / Self Care | Payer: Medicaid Other | Attending: Emergency Medicine | Admitting: Emergency Medicine

## 2023-05-18 ENCOUNTER — Emergency Department (HOSPITAL_COMMUNITY): Payer: Medicaid Other

## 2023-05-18 ENCOUNTER — Other Ambulatory Visit: Payer: Self-pay

## 2023-05-18 DIAGNOSIS — R079 Chest pain, unspecified: Secondary | ICD-10-CM | POA: Diagnosis present

## 2023-05-18 DIAGNOSIS — R072 Precordial pain: Secondary | ICD-10-CM | POA: Insufficient documentation

## 2023-05-18 LAB — CBC
HCT: 46.4 % — ABNORMAL HIGH (ref 36.0–46.0)
Hemoglobin: 15.7 g/dL — ABNORMAL HIGH (ref 12.0–15.0)
MCH: 34.4 pg — ABNORMAL HIGH (ref 26.0–34.0)
MCHC: 33.8 g/dL (ref 30.0–36.0)
MCV: 101.8 fL — ABNORMAL HIGH (ref 80.0–100.0)
Platelets: 178 10*3/uL (ref 150–400)
RBC: 4.56 MIL/uL (ref 3.87–5.11)
RDW: 14 % (ref 11.5–15.5)
WBC: 10.3 10*3/uL (ref 4.0–10.5)
nRBC: 0 % (ref 0.0–0.2)

## 2023-05-18 LAB — TROPONIN I (HIGH SENSITIVITY)
Troponin I (High Sensitivity): 3 ng/L (ref <18)
Troponin I (High Sensitivity): 3 ng/L (ref <18)

## 2023-05-18 LAB — COMPREHENSIVE METABOLIC PANEL
ALT: 13 U/L (ref 0–44)
AST: 21 U/L (ref 15–41)
Albumin: 2.8 g/dL — ABNORMAL LOW (ref 3.5–5.0)
Alkaline Phosphatase: 67 U/L (ref 38–126)
Anion gap: 10 (ref 5–15)
BUN: 5 mg/dL — ABNORMAL LOW (ref 6–20)
CO2: 27 mmol/L (ref 22–32)
Calcium: 7.5 mg/dL — ABNORMAL LOW (ref 8.9–10.3)
Chloride: 103 mmol/L (ref 98–111)
Creatinine, Ser: 0.58 mg/dL (ref 0.44–1.00)
GFR, Estimated: >60 mL/min (ref >60)
Glucose, Bld: 136 mg/dL — ABNORMAL HIGH (ref 70–99)
Potassium: 3.7 mmol/L (ref 3.5–5.1)
Sodium: 140 mmol/L (ref 135–145)
Total Bilirubin: 0.9 mg/dL (ref 0.3–1.2)
Total Protein: 5.8 g/dL — ABNORMAL LOW (ref 6.5–8.1)

## 2023-05-18 LAB — LIPASE, BLOOD: Lipase: 19 U/L (ref 11–51)

## 2023-05-18 LAB — CBG MONITORING, ED: Glucose-Capillary: 150 mg/dL — ABNORMAL HIGH (ref 70–99)

## 2023-05-18 MED ORDER — IPRATROPIUM-ALBUTEROL 0.5-2.5 (3) MG/3ML IN SOLN
3.0000 mL | Freq: Once | RESPIRATORY_TRACT | Status: DC
  Filled 2023-05-18: qty 3

## 2023-05-18 NOTE — ED Triage Notes (Addendum)
Pt bib [facility] from home c/o n/v and chest pain for the past three days that has progressively gotten worse. Pain in middle of chest and does not radiate. Pt also c/I lower abd pain and intermittent sob. 4 mg zofran with ems  Bp- 114/76 Hr- 80 Rr- 16 96% ra

## 2023-05-18 NOTE — Discharge Instructions (Signed)

## 2023-05-18 NOTE — ED Notes (Signed)
Called lab regarding CBC that was not in process or resulted, they advised no lavender top received.

## 2023-05-18 NOTE — ED Provider Triage Note (Signed)
Emergency Medicine Provider Triage Evaluation Note  [name] , a 57 y.o. female  was evaluated in triage.  Pt complains of chest pain.  Review of Systems  Positive: Chest pain Negative: fever  Physical Exam  BP 110/83 (BP Location: Right Arm)   Pulse 80   Temp 97.8 F (36.6 C) (Oral)   Resp 16   SpO2 90%  Gen:   Awake, no distress   Resp:  Wheezing bilaterally MSK:   Moves extremities without difficulty  Other:    Medical Decision Making  Medically screening exam initiated at 2:02 AM.  Appropriate orders placed.  [name] was informed that the remainder of the evaluation will be completed by another provider, this initial triage assessment does not replace that evaluation, and the importance of remaining in the ED until their evaluation is complete.     Zadie Rhine, MD 05/18/23 [id number]

## 2023-05-18 NOTE — ED Notes (Signed)
Patient placed onto 2 lpm via [place] due to lowering O2 sats while sleeping. Does have Hx of OSA.
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
## 2023-06-01 ENCOUNTER — Institutional Professional Consult (permissible substitution): Payer: Medicaid Other | Admitting: Plastic Surgery

## 2023-06-09 ENCOUNTER — Other Ambulatory Visit: Payer: Self-pay | Admitting: Family Medicine

## 2023-06-09 ENCOUNTER — Other Ambulatory Visit: Payer: Self-pay

## 2023-06-09 DIAGNOSIS — N644 Mastodynia: Secondary | ICD-10-CM

## 2023-06-15 ENCOUNTER — Other Ambulatory Visit: Payer: Medicaid Other

## 2023-07-03 ENCOUNTER — Other Ambulatory Visit: Payer: Medicaid Other

## 2023-07-13 ENCOUNTER — Institutional Professional Consult (permissible substitution): Payer: Medicaid Other | Admitting: Plastic Surgery

## 2023-08-03 ENCOUNTER — Ambulatory Visit: Payer: Medicaid Other

## 2023-08-03 ENCOUNTER — Other Ambulatory Visit: Payer: Medicaid Other

## 2023-08-03 ENCOUNTER — Ambulatory Visit
Admission: RE | Admit: 2023-08-03 | Discharge: 2023-08-03 | Disposition: A | Discharge status: Home / Self Care | Payer: Medicaid Other | Source: Ambulatory Visit | Attending: Family Medicine | Admitting: Family Medicine

## 2023-08-03 DIAGNOSIS — N644 Mastodynia: Secondary | ICD-10-CM

## 2023-08-30 ENCOUNTER — Telehealth: Payer: Self-pay | Admitting: Plastic Surgery

## 2023-08-30 NOTE — Telephone Encounter (Signed)
I called pt after speaking with [name], [name] will consult on pt implants on going smaller. I did make [name] aware that pt was seen by [name] upstairs. I called upstairs to verify that he was still active. I was told he was still active. [name] had also consulted with another surgeon in the same office.  On 06-14-23 with her consult with [facility] "We had a somewhat circuitous conversation about her breast complaints. [name] emphasizes her cosmetic concerns and desires a breast lift, but insists that this be billed as a breast reduction. [name] does have relatively large breasts and significant waterfall deformity related to her current implants, but [name] does not have obvious physical signs or symptoms of macromastia (no rash, shoulder grooving, or relevant history of intractable back, neck, shoulder pain, etc). I discussed that implant removal and mastopexy would be cosmetic and would not be billed through insurance. [name] understands additionally that at this time [name] is not a candidate for elective cosmetic surgery while actively smoking. I encourage her to pursue cessation and a second opinion should [name] desire. [name] can follow up with me PRN"  Pt heard from a friend that [name] should see [name]. [name] said [name] was going to call [name] office back first, then [name] said [name] may call us back to sch with [name]. I offered 11-10-23 Friday when I spoke with the pt. Waiting to hear back.  Pt has Also no showed Shelly [name] 2x. If patient make another apt and no shows a 3rd time, the pt will not be able to sched with [facility] again.

## 2023-08-31 ENCOUNTER — Other Ambulatory Visit: Payer: Medicaid Other

## 2023-10-31 ENCOUNTER — Emergency Department (HOSPITAL_COMMUNITY)
Admission: EM | Admit: 2023-10-31 | Discharge: 2023-11-01 | Discharge status: Against Medical Advice | Attending: Emergency Medicine | Admitting: Emergency Medicine

## 2023-10-31 ENCOUNTER — Other Ambulatory Visit: Payer: Self-pay

## 2023-10-31 DIAGNOSIS — Z5329 Procedure and treatment not carried out because of patient's decision for other reasons: Secondary | ICD-10-CM | POA: Insufficient documentation

## 2023-10-31 DIAGNOSIS — T7840XA Allergy, unspecified, initial encounter: Secondary | ICD-10-CM | POA: Diagnosis present

## 2023-10-31 MED ORDER — DEXAMETHASONE SODIUM PHOSPHATE 4 MG/ML IJ SOLN
4.0000 mg | Freq: Once | INTRAMUSCULAR | Status: AC
  Administered 2023-11-01: 4 mg via INTRAVENOUS
  Filled 2023-10-31: qty 1

## 2023-10-31 MED ORDER — FAMOTIDINE IN NACL 20-0.9 MG/50ML-% IV SOLN
20.0000 mg | Freq: Once | INTRAVENOUS | Status: AC
  Administered 2023-11-01: 20 mg via INTRAVENOUS
  Filled 2023-10-31: qty 50

## 2023-10-31 MED ORDER — DIPHENHYDRAMINE HCL 50 MG/ML IJ SOLN: 25.0000 mg | Freq: Once | INTRAMUSCULAR | Status: DC

## 2023-10-31 NOTE — ED Notes (Signed)
 Pt oxygen sats 72-75. Pt placed on 2L O2.

## 2023-10-31 NOTE — ED Triage Notes (Signed)
 Pt BIBA with c/o allergic reaction. Allergic to amoxicillin clavulanate. Pt reported SOB, emesis, hives, pale, and clammy. Given 0.3 epi IM at 2150 and 4mg  zofran at 2220 by EMS. Pt took 50mg  [place] at home around 2135. Pt complains of "feeling itchy" and redness noted on chest and upper arms.  BP 160/96

## 2023-11-01 ENCOUNTER — Emergency Department (HOSPITAL_COMMUNITY)

## 2023-11-01 LAB — I-STAT CHEM 8, ED
BUN: 6 mg/dL (ref 6–20)
Calcium, Ion: 1.03 mmol/L — ABNORMAL LOW (ref 1.15–1.40)
Chloride: 100 mmol/L (ref 98–111)
Creatinine, Ser: 0.8 mg/dL (ref 0.44–1.00)
Glucose, Bld: 130 mg/dL — ABNORMAL HIGH (ref 70–99)
HCT: 46 % (ref 36.0–46.0)
Hemoglobin: 15.6 g/dL — ABNORMAL HIGH (ref 12.0–15.0)
Potassium: 3.2 mmol/L — ABNORMAL LOW (ref 3.5–5.1)
Sodium: 140 mmol/L (ref 135–145)
TCO2: 29 mmol/L (ref 22–32)

## 2023-11-01 LAB — CBC WITH DIFFERENTIAL/PLATELET
Abs Immature Granulocytes: 0.06 10*3/uL (ref 0.00–0.07)
Basophils Absolute: 0 10*3/uL (ref 0.0–0.1)
Basophils Relative: 0 %
Eosinophils Absolute: 0 10*3/uL (ref 0.0–0.5)
Eosinophils Relative: 0 %
HCT: 48.1 % — ABNORMAL HIGH (ref 36.0–46.0)
Hemoglobin: 15.6 g/dL — ABNORMAL HIGH (ref 12.0–15.0)
Immature Granulocytes: 0 %
Lymphocytes Relative: 7 %
Lymphs Abs: 1 10*3/uL (ref 0.7–4.0)
MCH: 31.6 pg (ref 26.0–34.0)
MCHC: 32.4 g/dL (ref 30.0–36.0)
MCV: 97.6 fL (ref 80.0–100.0)
Monocytes Absolute: 0.3 10*3/uL (ref 0.1–1.0)
Monocytes Relative: 2 %
Neutro Abs: 13.1 10*3/uL — ABNORMAL HIGH (ref 1.7–7.7)
Neutrophils Relative %: 91 %
Platelets: 216 10*3/uL (ref 150–400)
RBC: 4.93 MIL/uL (ref 3.87–5.11)
RDW: 12.8 % (ref 11.5–15.5)
WBC: 14.5 10*3/uL — ABNORMAL HIGH (ref 4.0–10.5)
nRBC: 0 % (ref 0.0–0.2)

## 2023-11-01 MED ORDER — FAMOTIDINE 20 MG PO TABS: 20.0000 mg | ORAL_TABLET | Freq: Two times a day (BID) | ORAL | 0 refills | Status: AC

## 2023-11-01 MED ORDER — CETIRIZINE HCL 10 MG PO TABS: 10.0000 mg | ORAL_TABLET | Freq: Two times a day (BID) | ORAL | 0 refills | Status: AC

## 2023-11-01 NOTE — ED Notes (Signed)
 Pt O2 sats maintained between 88-95 while ambulating.

## 2023-11-01 NOTE — ED Provider Notes (Addendum)
 [place] EMERGENCY DEPARTMENT AT [facility] Provider Note   CSN: [id number] Arrival date & time: 10/31/23  2253     History  Chief Complaint  Patient presents with   Allergic Reaction    [name] is a 58 y.o. female.  The history is provided by the patient and the EMS personnel.  Allergic Reaction Presenting symptoms: rash   Severity:  Severe Context: medications   Context comment:  Augmentin Relieved by:  Antihistamines Worsened by:  Nothing Ineffective treatments:  Antihistamines Given Epinephrine by EMS for a reported reaction to Augmentin.       Home Medications Prior to Admission medications   Medication Sig Start Date End Date Taking? Authorizing Provider  cetirizine (ZYRTEC ALLERGY) 10 MG tablet Take 1 tablet (10 mg total) by mouth 2 (two) times daily. 11/01/23  Yes Tynetta Bachmann, MD  famotidine (PEPCID) 20 MG tablet Take 1 tablet (20 mg total) by mouth 2 (two) times daily. 11/01/23  Yes Venia Riveron, MD  cyanocobalamin (,VITAMIN B-12,) 1000 MCG/ML injection Does once a week as of 05-18-2020 08/17/19   [provider]  hyoscyamine (LEVSIN SL) 0.125 MG SL tablet Place under the tongue every 4 (four) hours as needed.  06/29/19   [provider]  methadone (DOLOPHINE) 10 MG tablet SMARTSIG:2 Tablet(s) By Mouth Every 12 Hours 08/17/19   [provider]  metoCLOPramide (REGLAN) 10 MG tablet Take 10 mg by mouth every 6 (six) hours as needed.  03/19/19   [provider]  omeprazole (PRILOSEC) 40 MG capsule Take one capsule twice a day for control of stomach acid problems 09/19/21   [provider]  promethazine (PHENERGAN) 25 MG tablet Take 1 tablet (25 mg total) by mouth every 6 (six) hours as needed for nausea or vomiting. 07/13/19   Charlynne Pander, MD  QUEtiapine (SEROQUEL) 50 MG tablet TAKE ONE TABLET AT BEDTIME FOR INSOMNIA 09/19/21   [provider]  sucralfate (CARAFATE) 1 g tablet Take 1 tablet (1  g total) by mouth 4 (four) times daily -  with meals and at bedtime. 07/13/19   Charlynne Pander, MD      Allergies    Amoxicillin-pot clavulanate, Codeine, Duloxetine, Nsaids, Sumatriptan succinate, and Corticosteroids    Review of Systems   Review of Systems  Constitutional:  Negative for fever.  Cardiovascular:  Negative for chest pain, palpitations and leg swelling.  Gastrointestinal:  Negative for vomiting.  Skin:  Positive for rash.    Physical Exam Updated Vital Signs BP 129/83 (BP Location: Right Arm)   Pulse 86   Temp 97.9 F (36.6 C) (Oral)   Resp 16   SpO2 96%  Physical Exam Vitals and nursing note reviewed. Exam conducted with a chaperone present.  Constitutional:      General: She is not in acute distress.    Appearance: Normal appearance. She is well-developed.  HENT:     Head: Normocephalic and atraumatic.     Nose: Nose normal.     Mouth/Throat:     Comments: No swelling of the lips tongue or uvula  Eyes:     Pupils: Pupils are equal, round, and reactive to light.  Cardiovascular:     Rate and Rhythm: Normal rate and regular rhythm.     Pulses: Normal pulses.     Heart sounds: Normal heart sounds.  Pulmonary:     Effort: Pulmonary effort is normal. No respiratory distress.     Breath sounds: Normal breath sounds.  No stridor. No wheezing, rhonchi or rales.  Abdominal:     General: Bowel sounds are normal. There is no distension.     Palpations: Abdomen is soft.     Tenderness: There is no abdominal tenderness. There is no guarding or rebound.  Musculoskeletal:        General: Normal range of motion.     Cervical back: Neck supple.  Skin:    General: Skin is dry.     Capillary Refill: Capillary refill takes less than 2 seconds.     Findings: No erythema or rash.     Comments: Redness of the volar forearms.    Neurological:     General: No focal deficit present.     Mental Status: She is alert and oriented to person, place, and time.     Deep  Tendon Reflexes: Reflexes normal.     ED Results / Procedures / Treatments   Labs (all labs ordered are listed, but only abnormal results are displayed) Results for orders placed or performed during the hospital encounter of 10/31/23  CBC with Differential   Collection Time: 11/01/23  2:34 AM  Result Value Ref Range   WBC 14.5 (H) 4.0 - 10.5 K/uL   RBC 4.93 3.87 - 5.11 MIL/uL   Hemoglobin 15.6 (H) 12.0 - 15.0 g/dL   HCT 29.5 (H) 62.1 - 30.8 %   MCV 97.6 80.0 - 100.0 fL   MCH 31.6 26.0 - 34.0 pg   MCHC 32.4 30.0 - 36.0 g/dL   RDW 65.7 84.6 - 96.2 %   Platelets 216 150 - 400 K/uL   nRBC 0.0 0.0 - 0.2 %   Neutrophils Relative % 91 %   Neutro Abs 13.1 (H) 1.7 - 7.7 K/uL   Lymphocytes Relative 7 %   Lymphs Abs 1.0 0.7 - 4.0 K/uL   Monocytes Relative 2 %   Monocytes Absolute 0.3 0.1 - 1.0 K/uL   Eosinophils Relative 0 %   Eosinophils Absolute 0.0 0.0 - 0.5 K/uL   Basophils Relative 0 %   Basophils Absolute 0.0 0.0 - 0.1 K/uL   Immature Granulocytes 0 %   Abs Immature Granulocytes 0.06 0.00 - 0.07 K/uL  I-stat chem 8, ED (not at [facility], [facility] or [facility])   Collection Time: 11/01/23  2:44 AM  Result Value Ref Range   Sodium 140 135 - 145 mmol/L   Potassium 3.2 (L) 3.5 - 5.1 mmol/L   Chloride 100 98 - 111 mmol/L   BUN 6 6 - 20 mg/dL   Creatinine, Ser 9.52 0.44 - 1.00 mg/dL   Glucose, Bld 841 (H) 70 - 99 mg/dL   Calcium, Ion 3.24 (L) 1.15 - 1.40 mmol/L   TCO2 29 22 - 32 mmol/L   Hemoglobin 15.6 (H) 12.0 - 15.0 g/dL   HCT 40.1 02.7 - 25.3 %   DG Chest Portable 1 View Result Date: 11/01/2023 CLINICAL DATA:  Allergic reaction. EXAM: PORTABLE CHEST 1 VIEW COMPARISON:  Chest x-ray May 18, 2023. FINDINGS: Mild streaky bibasilar opacities. No consolidation. No visible pleural effusions or pneumothorax. Cardiomediastinal silhouette is within normal limits. No acute bony abnormality. Partially imaged ACDF. IMPRESSION: Mild streaky bibasilar opacities, compatible with atelectasis and/or  scarring. Electronically Signed   By: Feliberto Harts M.D.   On: 11/01/2023 02:19    EKG EKG Interpretation Date/Time:  Tuesday October 31 2023 66:44:03 EDT Ventricular Rate:  92 PR Interval:  158 QRS Duration:  93 QT Interval:  356 QTC Calculation: 441 R  Axis:   20  Text Interpretation: Sinus rhythm Confirmed by Khalon Cansler ([id number]) on 10/31/2023 11:50:41 PM  Radiology DG Chest Portable 1 View Result Date: 11/01/2023 CLINICAL DATA:  Allergic reaction. EXAM: PORTABLE CHEST 1 VIEW COMPARISON:  Chest x-ray May 18, 2023. FINDINGS: Mild streaky bibasilar opacities. No consolidation. No visible pleural effusions or pneumothorax. Cardiomediastinal silhouette is within normal limits. No acute bony abnormality. Partially imaged ACDF. IMPRESSION: Mild streaky bibasilar opacities, compatible with atelectasis and/or scarring. Electronically Signed   By: Feliberto Harts M.D.   On: 11/01/2023 02:19    Procedures Procedures    Medications Ordered in ED Medications  famotidine (PEPCID) IVPB 20 mg premix (0 mg Intravenous Stopped 11/01/23 0232)  dexamethasone (DECADRON) injection 4 mg (4 mg Intravenous Given 11/01/23 0018)    ED Course/ Medical Decision Making/ A&P                                 Medical Decision Making Amount and/or Complexity of Data Reviewed Labs: ordered.    Details: White count slight elevation 14.5, elevated hemoglobin 15.6, normal platelets.  Normal sodium 140, slight low potassium 3.2, normal creatinine  Radiology: ordered.  Risk OTC drugs. Prescription drug management. Risk Details: Patient stated she has not had taste since 2011 when she had steroids.  This is not a true allergy and in the setting of having been given epi by EMS one dose of dexamethasone is indicated.  Patient had already taken 50 mg of benadryl at home and cannot receive more at this time.  Pepcid IV was added as a secondary anti-histamine and plan initially was to monitor for 4 hours for  recurrence post epinephrine.  O2 saturation was in the 70s and then 80s not on room air but patient was somnolent, likely from benadryl and potentially the methadone she is prescribed.  Airway was widely patent.  Patient desaturates when sleeping.  O2 saturation is in the low 90s when awake.  This is unsafe for discharge.  Patient declines admission stating this is likely how she is and how her father in law was whom she used to care for. Patient is not SI or Hi, and patient does not want to stay because she feels anxious, likely withdrawing from home medication.  I stated that I could not discharge patient as patient is not safe for discharge without oxygen. Patient has left against medical advice      Final Clinical Impression(s) / ED Diagnoses Final diagnoses:  Allergic reaction, initial encounter   Patient has left against medical advice.   Rx / DC Orders ED Discharge Orders          Ordered    famotidine (PEPCID) 20 MG tablet  2 times daily        11/01/23 0322    cetirizine (ZYRTEC ALLERGY) 10 MG tablet  2 times daily        11/01/23 0322                Rick Carruthers, MD 11/01/23 9147

## 2023-11-13 ENCOUNTER — Other Ambulatory Visit: Payer: Self-pay

## 2023-11-13 ENCOUNTER — Emergency Department (HOSPITAL_COMMUNITY)

## 2023-11-13 ENCOUNTER — Emergency Department (HOSPITAL_COMMUNITY)
Admission: EM | Admit: 2023-11-13 | Discharge: 2023-11-13 | Discharge status: Against Medical Advice | Attending: Emergency Medicine | Admitting: Emergency Medicine

## 2023-11-13 DIAGNOSIS — R7989 Other specified abnormal findings of blood chemistry: Secondary | ICD-10-CM | POA: Diagnosis not present

## 2023-11-13 DIAGNOSIS — T50901A Poisoning by unspecified drugs, medicaments and biological substances, accidental (unintentional), initial encounter: Secondary | ICD-10-CM

## 2023-11-13 DIAGNOSIS — T403X1A Poisoning by methadone, accidental (unintentional), initial encounter: Secondary | ICD-10-CM | POA: Diagnosis not present

## 2023-11-13 DIAGNOSIS — R4182 Altered mental status, unspecified: Secondary | ICD-10-CM | POA: Diagnosis present

## 2023-11-13 LAB — COMPREHENSIVE METABOLIC PANEL WITH GFR
ALT: 17 U/L (ref 0–44)
AST: 18 U/L (ref 15–41)
Albumin: 3.4 g/dL — ABNORMAL LOW (ref 3.5–5.0)
Alkaline Phosphatase: 62 U/L (ref 38–126)
Anion gap: 8 (ref 5–15)
BUN: 8 mg/dL (ref 6–20)
CO2: 28 mmol/L (ref 22–32)
Calcium: 8.2 mg/dL — ABNORMAL LOW (ref 8.9–10.3)
Chloride: 103 mmol/L (ref 98–111)
Creatinine, Ser: 0.68 mg/dL (ref 0.44–1.00)
GFR, Estimated: >60 mL/min (ref >60)
Glucose, Bld: 97 mg/dL (ref 70–99)
Potassium: 3.4 mmol/L — ABNORMAL LOW (ref 3.5–5.1)
Sodium: 139 mmol/L (ref 135–145)
Total Bilirubin: 0.5 mg/dL (ref 0.0–1.2)
Total Protein: 6.6 g/dL (ref 6.5–8.1)

## 2023-11-13 LAB — CBC WITH DIFFERENTIAL/PLATELET
Abs Immature Granulocytes: 0.03 10*3/uL (ref 0.00–0.07)
Basophils Absolute: 0 10*3/uL (ref 0.0–0.1)
Basophils Relative: 0 %
Eosinophils Absolute: 0.1 10*3/uL (ref 0.0–0.5)
Eosinophils Relative: 1 %
HCT: 41.1 % (ref 36.0–46.0)
Hemoglobin: 13.4 g/dL (ref 12.0–15.0)
Immature Granulocytes: 0 %
Lymphocytes Relative: 19 %
Lymphs Abs: 1.9 10*3/uL (ref 0.7–4.0)
MCH: 32.1 pg (ref 26.0–34.0)
MCHC: 32.6 g/dL (ref 30.0–36.0)
MCV: 98.3 fL (ref 80.0–100.0)
Monocytes Absolute: 0.4 10*3/uL (ref 0.1–1.0)
Monocytes Relative: 4 %
Neutro Abs: 7.6 10*3/uL (ref 1.7–7.7)
Neutrophils Relative %: 76 %
Platelets: 189 10*3/uL (ref 150–400)
RBC: 4.18 MIL/uL (ref 3.87–5.11)
RDW: 12.8 % (ref 11.5–15.5)
WBC: 10.1 10*3/uL (ref 4.0–10.5)
nRBC: 0 % (ref 0.0–0.2)

## 2023-11-13 LAB — TROPONIN I (HIGH SENSITIVITY)
Troponin I (High Sensitivity): 5 ng/L (ref <18)
Troponin I (High Sensitivity): 23 ng/L — ABNORMAL HIGH (ref <18)

## 2023-11-13 LAB — I-STAT CG4 LACTIC ACID, ED: Lactic Acid, Venous: 1.5 mmol/L (ref 0.5–1.9)

## 2023-11-13 LAB — ETHANOL: Alcohol, Ethyl (B): <10 mg/dL (ref <10)

## 2023-11-13 MED ORDER — SODIUM CHLORIDE 0.9 % IV BOLUS
1000.0000 mL | Freq: Once | INTRAVENOUS | Status: AC
  Administered 2023-11-13: 1000 mL via INTRAVENOUS

## 2023-11-13 NOTE — ED Notes (Signed)
 ED Provider at bedside.

## 2023-11-13 NOTE — ED Notes (Signed)
 Patient transported to CT

## 2023-11-13 NOTE — ED Provider Notes (Addendum)
 [place] EMERGENCY DEPARTMENT AT [facility] Provider Note   CSN: [id number] Arrival date & time: 11/13/23  1846     History  Chief Complaint  Patient presents with   Drug Overdose    [name] is a 58 y.o. female.   Drug Overdose  Patient brought in for mental status change.  Unresponsive.  Given Narcan with some improvement.  Reportedly did have some chest compressions done by son.  Patient does not really want what happened or understand why it happened.  He is on chronic methadone with states that is unchanged.  Still somewhat somnolent.    Past Medical History:  Diagnosis Date   Anxiety    Arthritis    Chronic back pain    had n/v and  fainting after procedure. N/v continues   Chronic pain    Cysts of both ovaries    Depression    GERD (gastroesophageal reflux disease)    PONV (postoperative nausea and vomiting)    takes iv med for nausea   Right lower quadrant pain    Vitamin B 12 deficiency     Home Medications Prior to Admission medications   Medication Sig Start Date End Date Taking? Authorizing Provider  cetirizine (ZYRTEC ALLERGY) 10 MG tablet Take 1 tablet (10 mg total) by mouth 2 (two) times daily. 11/01/23   Palumbo, April, MD  cyanocobalamin (,VITAMIN B-12,) 1000 MCG/ML injection Does once a week as of 05-18-2020 08/17/19   [provider]  famotidine (PEPCID) 20 MG tablet Take 1 tablet (20 mg total) by mouth 2 (two) times daily. 11/01/23   Palumbo, April, MD  hyoscyamine (LEVSIN SL) 0.125 MG SL tablet Place under the tongue every 4 (four) hours as needed.  06/29/19   [provider]  methadone (DOLOPHINE) 10 MG tablet SMARTSIG:2 Tablet(s) By Mouth Every 12 Hours 08/17/19   [provider]  metoCLOPramide (REGLAN) 10 MG tablet Take 10 mg by mouth every 6 (six) hours as needed.  03/19/19   [provider]  omeprazole (PRILOSEC) 40 MG capsule Take one capsule twice a day for control of stomach acid problems  09/19/21   [provider]  promethazine (PHENERGAN) 25 MG tablet Take 1 tablet (25 mg total) by mouth every 6 (six) hours as needed for nausea or vomiting. 07/13/19   Charlynne Pander, MD  QUEtiapine (SEROQUEL) 50 MG tablet TAKE ONE TABLET AT BEDTIME FOR INSOMNIA 09/19/21   [provider]  sucralfate (CARAFATE) 1 g tablet Take 1 tablet (1 g total) by mouth 4 (four) times daily -  with meals and at bedtime. 07/13/19   Charlynne Pander, MD      Allergies    Amoxicillin-pot clavulanate, Codeine, Duloxetine, Nsaids, Sumatriptan succinate, and Corticosteroids    Review of Systems   Review of Systems  Physical Exam Updated Vital Signs BP 120/89   Pulse 69   Temp (!) 97.1 F (36.2 C) (Oral)   Resp 11   Ht 5\' 2"  (1.575 m)   Wt 86.2 kg   SpO2 95%   BMI 34.75 kg/m  Physical Exam Vitals and nursing note reviewed.  Eyes:     Extraocular Movements: Extraocular movements intact.  Cardiovascular:     Rate and Rhythm: Regular rhythm.  Chest:     Chest wall: No tenderness.  Abdominal:     Tenderness: There is no abdominal tenderness.  Musculoskeletal:     Cervical back: Neck supple.  Skin:    General: Skin  is warm.  Neurological:     Comments: Does go back to sitting with her head forward and awake and answers questions but mildly slow to answer.  Has close.     ED Results / Procedures / Treatments   Labs (all labs ordered are listed, but only abnormal results are displayed) Labs Reviewed  COMPREHENSIVE METABOLIC PANEL WITH GFR - Abnormal; Notable for the following components:      Result Value   Potassium 3.4 (*)    Calcium 8.2 (*)    Albumin 3.4 (*)    All other components within normal limits  TROPONIN I (HIGH SENSITIVITY) - Abnormal; Notable for the following components:   Troponin I (High Sensitivity) 23 (*)    All other components within normal limits  CULTURE, BLOOD (ROUTINE X 2)  CULTURE, BLOOD (ROUTINE X 2)  ETHANOL  CBC WITH  DIFFERENTIAL/PLATELET  I-STAT CG4 LACTIC ACID, ED  TROPONIN I (HIGH SENSITIVITY)    EKG EKG Interpretation Date/Time:  Monday November 13 2023 19:00:08 EDT Ventricular Rate:  70 PR Interval:  171 QRS Duration:  92 QT Interval:  413 QTC Calculation: 446 R Axis:   13  Text Interpretation: Sinus rhythm Confirmed by Benjiman Core [phone]) on 11/13/2023 7:20:55 PM  Radiology CT HEAD WO CONTRAST ( ) Result Date: 11/13/2023 CLINICAL DATA:  Altered mental status. EXAM: CT HEAD WITHOUT CONTRAST TECHNIQUE: Contiguous axial images were obtained from the base of the skull through the vertex without intravenous contrast. RADIATION DOSE REDUCTION: This exam was performed according to the departmental dose-optimization program which includes automated exposure control, adjustment of the mA and/or kV according to patient size and/or use of iterative reconstruction technique. COMPARISON:  October 08, 2006 FINDINGS: Brain: No evidence of acute infarction, hemorrhage, hydrocephalus, extra-axial collection or mass lesion/mass effect. Vascular: No hyperdense vessel or unexpected calcification. Skull: Normal. Negative for fracture or focal lesion. Sinuses/Orbits: No acute finding. Other: None. IMPRESSION: No acute intracranial pathology. Electronically Signed   By: Aram Candela M.D.   On: 11/13/2023 21:26   DG Chest Portable 1 View Result Date: 11/13/2023 CLINICAL DATA:  Altered mental status. EXAM: PORTABLE CHEST 1 VIEW COMPARISON:  November 01, 2023 FINDINGS: The heart size and mediastinal contours are within normal limits. Mild linear scarring and/or atelectasis is seen within the mid left lung. There is no evidence of an acute infiltrate, pleural effusion or pneumothorax. Postoperative changes are seen within the cervical spine. Multilevel degenerative changes are seen throughout the thoracic spine. IMPRESSION: Mild mid left lung linear scarring and/or atelectasis. Electronically Signed   By: Aram Candela M.D.    On: 11/13/2023 21:24    Procedures Procedures    Medications Ordered in ED Medications  sodium chloride 0.9 % bolus 1,000 mL (0 mLs Intravenous Stopped 11/13/23 2138)    ED Course/ Medical Decision Making/ A&P                                 Medical Decision Making Amount and/or Complexity of Data Reviewed Labs: ordered. Radiology: ordered.   Patient with mild hypotension.  Mental status change.  Improved at home with Narcan.  Differential diagnosis does include opiate overdose but also causes such as infection, intracranial hemorrhage.  Will get basic blood work.  Will get head CT.  Will give fluid bolus.  Troponin mildly elevated.  Patient mental status improving.  Now able to answer questions.  Family and her husband and son are  here.  Had gotten some CPR.  Son found patient slumped over out of her low chair.  Patient's later admits to taking extra of her methadone.  States she had been titrating down but then was hurting more so her son gave her an extra dose.  Likely cause of the mental status change.  However troponin mildly elevated.  Could potentially could have some hypoxia or arrhythmia that caused this.  However patient not willing to stay for admission.  Will discharge.        Final Clinical Impression(s) / ED Diagnoses Final diagnoses:  Accidental overdose, initial encounter    Rx / DC Orders ED Discharge Orders     None         Benjiman Core, MD 11/13/23 Othella Boyer, MD 11/13/23 2245

## 2023-11-13 NOTE — ED Triage Notes (Signed)
 Pt bib g[facility] for possible overdose unknown substance. Given narcan and was effective  Son found on floor he did have do a couple of compressions.

## 2023-11-13 NOTE — Discharge Instructions (Addendum)
 Your lower heart enzyme was mildly elevated likely due to your overdose.  Do not take extra doses of your methadone.  You are leaving against my advice.  Follow-up with your doctor.

## 2023-11-14 ENCOUNTER — Institutional Professional Consult (permissible substitution): Payer: Medicaid Other | Admitting: Plastic Surgery

## 2023-11-18 LAB — CULTURE, BLOOD (ROUTINE X 2)
Culture: NO GROWTH
Culture: NO GROWTH

## 2024-04-02 ENCOUNTER — Other Ambulatory Visit (HOSPITAL_COMMUNITY): Payer: Self-pay

## 2024-05-17 ENCOUNTER — Emergency Department (HOSPITAL_BASED_OUTPATIENT_CLINIC_OR_DEPARTMENT_OTHER)
Admission: EM | Admit: 2024-05-17 | Discharge: 2024-05-17 | Disposition: A | Discharge status: Home / Self Care | Attending: Emergency Medicine | Admitting: Emergency Medicine

## 2024-05-17 ENCOUNTER — Other Ambulatory Visit: Payer: Self-pay

## 2024-05-17 ENCOUNTER — Encounter (HOSPITAL_BASED_OUTPATIENT_CLINIC_OR_DEPARTMENT_OTHER): Payer: Self-pay

## 2024-05-17 ENCOUNTER — Emergency Department (HOSPITAL_BASED_OUTPATIENT_CLINIC_OR_DEPARTMENT_OTHER)

## 2024-05-17 DIAGNOSIS — W19XXXA Unspecified fall, initial encounter: Secondary | ICD-10-CM

## 2024-05-17 DIAGNOSIS — M545 Low back pain, unspecified: Secondary | ICD-10-CM | POA: Insufficient documentation

## 2024-05-17 DIAGNOSIS — W010XXA Fall on same level from slipping, tripping and stumbling without subsequent striking against object, initial encounter: Secondary | ICD-10-CM | POA: Insufficient documentation

## 2024-05-17 MED ORDER — HYDROCODONE-ACETAMINOPHEN 5-325 MG PO TABS: 1.0000 | ORAL_TABLET | ORAL | 0 refills | Status: AC | PRN

## 2024-05-17 MED ORDER — OXYCODONE-ACETAMINOPHEN 5-325 MG PO TABS
1.0000 | ORAL_TABLET | ORAL | Status: DC | PRN
  Administered 2024-05-17: 1 via ORAL
  Filled 2024-05-17: qty 1

## 2024-05-17 MED ORDER — CYCLOBENZAPRINE HCL 10 MG PO TABS
10.0000 mg | ORAL_TABLET | Freq: Three times a day (TID) | ORAL | 0 refills | Status: AC
Start: 2024-05-17 — End: 2024-05-22

## 2024-05-17 NOTE — ED Provider Notes (Signed)
 [place] EMERGENCY DEPARTMENT AT [facility] Provider Note   CSN: [id number] Arrival date & time: 05/17/24  8167     Patient presents with: [name]   [name] is a 58 y.o. female.   58 year old female with a past medical history of chronic back pain prior under pain management presents to the ED with a chief complaint of mechanical fall.  Patient reports she tripped over her fireplace around 2 AM, causing her to braced herself with her hands, felt that her lower spine curved.  She is reporting pain along the low back pain specially with any type of ambulation and sitting down.  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  She has taken some ibuprofen versus Tylenol  without any improvement in symptoms.  She denies any bowel or bladder complaints at this time, no prior history of IV drug use, no fever.  The history is provided by the patient.  Fall       Prior to Admission medications   Medication Sig Start Date End Date Taking? Authorizing Provider  cyclobenzaprine  (FLEXERIL ) 10 MG tablet Take 1 tablet (10 mg total) by mouth 3 (three) times daily for 5 days. 05/17/24 05/22/24 Yes Kemani Heidel, PA-C  HYDROcodone -acetaminophen  (NORCO/VICODIN) 5-325 MG tablet Take 1 tablet by mouth every 4 (four) hours as needed for up to 3 days. 05/17/24 05/20/24 Yes Rickey Sadowski, PA-C  cetirizine  (ZYRTEC  ALLERGY) 10 MG tablet Take 1 tablet (10 mg total) by mouth 2 (two) times daily. 11/01/23   Palumbo, April, MD  cyanocobalamin (,VITAMIN B-12,) 1000 MCG/ML injection Does once a week as of 05-18-2020 08/17/19   [provider]  famotidine  (PEPCID ) 20 MG tablet Take 1 tablet (20 mg total) by mouth 2 (two) times daily. 11/01/23   Palumbo, April, MD  hyoscyamine (LEVSIN SL) 0.125 MG SL tablet Place under the tongue every 4 (four) hours as needed.  06/29/19   [provider]  metoCLOPramide  (REGLAN ) 10 MG tablet Take 10 mg  by mouth every 6 (six) hours as needed.  03/19/19   [provider]  omeprazole (PRILOSEC) 40 MG capsule Take one capsule twice a day for control of stomach acid problems 09/19/21   [provider]  promethazine  (PHENERGAN ) 25 MG tablet Take 1 tablet (25 mg total) by mouth every 6 (six) hours as needed for nausea or vomiting. 07/13/19   Patt Alm Macho, MD  QUEtiapine (SEROQUEL) 50 MG tablet TAKE ONE TABLET AT BEDTIME FOR INSOMNIA 09/19/21   [provider]  sucralfate  (CARAFATE ) 1 g tablet Take 1 tablet (1 g total) by mouth 4 (four) times daily -  with meals and at bedtime. 07/13/19   Patt Alm Macho, MD    Allergies: Amoxicillin-pot clavulanate, Codeine, Duloxetine, Nsaids, Sumatriptan succinate, and Corticosteroids    Review of Systems  Constitutional:  Negative for fever.  Musculoskeletal:  Positive for back pain.    Updated Vital Signs BP (!) 157/98 (BP Location: Left Arm)   Pulse 98   Temp 98.6 F (37 C)   Resp 16   Ht 5' 2 (1.575 m)   Wt 64.4 kg   SpO2 100%   BMI 25.97 kg/m   Physical Exam Vitals and nursing note reviewed.  Constitutional:      Appearance: Normal appearance.  HENT:     Mouth/Throat:     Mouth: Mucous membranes are moist.  Cardiovascular:     Rate and Rhythm: Normal rate.  Pulmonary:     Effort: Pulmonary effort is normal.  Musculoskeletal:     Cervical back: Normal range of motion and neck supple.     Lumbar back: Tenderness present. Injury: .jo.    Comments: RLE- KF,KE 5/5 strength LLE- HF, HE 5/5 strength Normal gait. No pronator drift. No leg drop.  CN I, II and VIII not tested. CN II-XII grossly intact bilaterally.      Skin:    General: Skin is warm and dry.  Neurological:     Mental Status: She is alert and oriented to person, place, and time.     (all labs ordered are listed, but only abnormal results are displayed) Labs Reviewed - No data to display  EKG: None  Radiology: DG Thoracic Spine 2  View Result Date: 05/17/2024 CLINICAL DATA:  Fall, mid back pain EXAM: THORACIC SPINE 2 VIEWS COMPARISON:  None Available. FINDINGS: Normal alignment. No fracture. Anterior degenerative spurring in the mid to lower thoracic spine. IMPRESSION: No acute bony abnormality. Electronically Signed   By: Franky Crease M.D.   On: 05/17/2024 20:19   DG Lumbar Spine Complete Result Date: 05/17/2024 CLINICAL DATA:  Fall, low back pain EXAM: LUMBAR SPINE - COMPLETE 4+ VIEW COMPARISON:  None Available. FINDINGS: Normal alignment. No fracture. Mild degenerative facet disease in the lower lumbar spine. Disc spaces maintained. IMPRESSION: No acute bony abnormality. Electronically Signed   By: Franky Crease M.D.   On: 05/17/2024 20:18     Procedures   Medications Ordered in the ED  oxyCODONE -acetaminophen  (PERCOCET/ROXICET) 5-325 MG per tablet 1 tablet (1 tablet Oral Given 05/17/24 2132)                                    Medical Decision Making Amount and/or Complexity of Data Reviewed Radiology: ordered.  Risk Prescription drug management.   Patient with a past medical history of chronic back pain presents to the ED status post mechanical fall per reports she fell forward while trying to get her fireplace started around 2 AM this morning.  No loss of consciousness, did not strike her head, not on any blood thinners.  Reports ongoing pain to her lumbar spine.  She did try to brace her fall with bilateral hands, states that she felt that her spine likely twisted.  She is to see pain management, however does not have a physician established at this time.  She is to follow-up with neurosurgery after her multiple neck and back surgeries but has not been able to do so in several years.  She reports the pain is excruciating at this time is tried over-the-counter medication without any improvement in symptoms.   On evaluation patient is ambulatory in the fast-track room with a steady gait.  She is actively  eating chips.  She complains of pain along her neck which she does have good range of motion, she complains of lumbar spine tenderness there is no bruising, no flank tenderness noted.  She is able to fully move her bilateral lower extremities.  Does not have any bowel or bladder complaints.  X-ray of the thoracic along with lumbar spine did not show any acute findings at this time.  I did review her PDMP, I do see prior prescriptions for methadone , she reports she is currently not taking this medication.  She reports she is not on any narcotic pain medication at this time.  I do feel  this patient has an acute fall this morning, she does warrant some pain medication at this time.  Patient is hemodynamically stable for discharge.  Portions of this note were generated with Scientist, clinical (histocompatibility and immunogenetics). Dictation errors may occur despite best attempts at proofreading.  Final diagnoses:  Fall, initial encounter  Acute bilateral low back pain without sciatica    ED Discharge Orders          Ordered    HYDROcodone -acetaminophen  (NORCO/VICODIN) 5-325 MG tablet  Every 4 hours PRN        05/17/24 2203    cyclobenzaprine  (FLEXERIL ) 10 MG tablet  3 times daily        05/17/24 2203               Macintyre Alexa, PA-C 05/17/24 2208    Emil Share, DO 05/17/24 2225

## 2024-05-17 NOTE — Discharge Instructions (Addendum)
 You are given a short prescription to help with pain control.  This medication is call Norco, please take 1 tablet every 4 hours to help with severe pain.  The second medication is a muscle relaxer called Flexeril , you may take 1 tablet up to 3 times a day for the next 5 days.  Please follow-up with your primary care physician versus neurosurgeon for your ongoing back pain.

## 2024-05-17 NOTE — ED Triage Notes (Signed)
 Pt reports tripping over a piece of furniture and fell onto her fireplace at home around 0200. Pt reports bracing herself with hands. Denies hitting head/face but reports whiplash. Reporting pain in back of neck, mid-lower back, bilateral palms (bruising to right palm, no swelling), bilateral elbows, and right knee (abrasion note, no bruising/swelling).  NAD noted in triage. Pt reports previous neck surgery. Pt ambulatory to triage.

## 2024-05-22 NOTE — Progress Notes (Deleted)
 Referring Physician:  Tanda Prentice DEL, MD [id number],  [place] [address]  Primary Physician:  Tanda Prentice DEL, MD  History of Present Illness: 05/22/2024*** [name] has a history of secondary hypothyroidism, pain-dysfunction syndrome, obesity, depression, GAD, vitamin D deficiency.   History of multiple neck and back surgeries?***  History orf chronic LBP. See in ED on 05/17/24 s/p fall with increased back pain.   Low back pain.  Any leg pain?  Has seen pain management in the past and has taken methadone  previously. Was given norco 5 from ED visit.   Duration: *** Location: *** Quality: *** Severity: ***  Precipitating: aggravated by *** Modifying factors: made better by *** Weakness: none Timing: ***  Tobacco use: Does not smoke.   Bowel/Bladder Dysfunction: none  Conservative measures:  Physical therapy: *** has not participated in recently? Multimodal medical therapy including regular antiinflammatories: *** flexeril , meloxicam, hydromorphone  Injections: *** no epidural steroid injections?  Past Surgery: *** Neck surgery around 2011/2012 No lumbar surgery  [name] has ***no symptoms of cervical myelopathy.  The symptoms are causing a significant impact on the patient's life.   Review of Systems:  A 10 point review of systems is negative, except for the pertinent positives and negatives detailed in the HPI.  Past Medical History: Past Medical History:  Diagnosis Date   Anxiety    Arthritis    Chronic back pain    had n/v and  fainting after procedure. N/v continues   Chronic pain    Cysts of both ovaries    Depression    GERD (gastroesophageal reflux disease)    PONV (postoperative nausea and vomiting)    takes iv med for nausea   Right lower quadrant pain    Vitamin B 12 deficiency     Past Surgical History: Past Surgical History:  Procedure Laterality Date   ABDOMINAL HYSTERECTOMY     AUGMENTATION MAMMAPLASTY      BARTHOLIN GLAND CYST EXCISION     CERVICAL FUSION  20111 or 2012   plates and screws in neck   CHOLECYSTECTOMY N/A 09/08/2015   Procedure: LAPAROSCOPIC CHOLECYSTECTOMY WITH INTRAOPERATIVE CHOLANGIOGRAM;  Surgeon: Deward Null III, MD;  Location: [facility];  Service: General;  Laterality: N/A;   HERNIA REPAIR     1 abdominal hernia , i umbilical    HIATAL HERNIA REPAIR     INSERTION OF MESH N/A 08/11/2017   Procedure: INSERTION OF MESH;  Surgeon: Rubin Calamity, MD;  Location: [facility];  Service: General;  Laterality: N/A;   LAPAROTOMY Right 05/21/2020   Procedure: LAPAROTOMY, RIGHT SALPINGO-OOPHORECTOMY;  Surgeon: Johnnye Ade, MD;  Location: [facility];  Service: Gynecology;  Laterality: Right;   LEFT OOPHORECTOMY     intestine twisted around tube and ovary    UMBILICAL HERNIA REPAIR N/A 08/11/2017   Procedure: LAPAROSCOPIC UMBILICAL HERNIA REPAIR;  Surgeon: Rubin Calamity, MD;  Location: [facility] OR;  Service: General;  Laterality: N/A;   VULVECTOMY Left 05/21/2020   Procedure: EXCISION OF LEFT VULVAR SKIN LESION;  Surgeon: Johnnye Ade, MD;  Location: [facility];  Service: Gynecology;  Laterality: Left;    Allergies: Allergies as of 05/30/2024 - Review Complete 05/17/2024  Allergen Reaction Noted   Amoxicillin-pot clavulanate Hives 10/31/2023   Codeine  09/07/2015   Duloxetine  09/07/2015   Nsaids  09/08/2013   Sumatriptan succinate  09/07/2015   Corticosteroids Nausea Only, Rash, and Other (See Comments) 09/08/2013    Medications:  Outpatient Encounter Medications as of 05/30/2024  Medication Sig   cetirizine  (ZYRTEC  ALLERGY) 10 MG tablet Take 1 tablet (10 mg total) by mouth 2 (two) times daily.   cyanocobalamin (,VITAMIN B-12,) 1000 MCG/ML injection Does once a week as of 05-18-2020   cyclobenzaprine  (FLEXERIL ) 10 MG tablet Take 1 tablet (10 mg total) by mouth 3 (three) times daily for 5 days.   famotidine  (PEPCID ) 20 MG tablet Take 1 tablet (20  mg total) by mouth 2 (two) times daily.   hyoscyamine (LEVSIN SL) 0.125 MG SL tablet Place under the tongue every 4 (four) hours as needed.    metoCLOPramide  (REGLAN ) 10 MG tablet Take 10 mg by mouth every 6 (six) hours as needed.    omeprazole (PRILOSEC) 40 MG capsule Take one capsule twice a day for control of stomach acid problems   promethazine  (PHENERGAN ) 25 MG tablet Take 1 tablet (25 mg total) by mouth every 6 (six) hours as needed for nausea or vomiting.   QUEtiapine (SEROQUEL) 50 MG tablet TAKE ONE TABLET AT BEDTIME FOR INSOMNIA   sucralfate  (CARAFATE ) 1 g tablet Take 1 tablet (1 g total) by mouth 4 (four) times daily -  with meals and at bedtime.   No facility-administered encounter medications on file as of 05/30/2024.    Social History: Social History   Tobacco Use   Smoking status: Former    Current packs/day: 0.00    Average packs/day: 0.5 packs/day for 35.0 years (17.5 ttl pk-yrs)    Types: Cigarettes    Start date: 08/09/1978    Quit date: 08/09/2013    Years since quitting: 10.7   Smokeless tobacco: Never  Vaping Use   Vaping status: Never Used  Substance Use Topics   Alcohol use: No   Drug use: No    Family Medical History: Family History  Problem Relation Age of Onset   Diabetes Mother    Heart disease Mother    Heart disease Sister    Heart disease Brother    Thyroid  disease Neg Hx     Physical Examination: There were no vitals filed for this visit.  General: Patient is well developed, well nourished, calm, collected, and in no apparent distress. Attention to examination is appropriate.  Respiratory: Patient is breathing without any difficulty.   NEUROLOGICAL:     Awake, alert, oriented to person, place, and time.  Speech is clear and fluent. Fund of knowledge is appropriate.   Cranial Nerves: Pupils equal round and reactive to light.  Facial tone is symmetric.    *** ROM of cervical spine *** pain *** posterior cervical tenderness. ***  tenderness in bilateral trapezial region.   *** ROM of lumbar spine *** pain *** posterior lumbar tenderness.   No abnormal lesions on exposed skin.   Strength: Side Biceps Triceps Deltoid Interossei Grip Wrist Ext. Wrist Flex.  R 5 5 5 5 5 5 5   L 5 5 5 5 5 5 5    Side Iliopsoas Quads Hamstring PF DF EHL  R 5 5 5 5 5 5   L 5 5 5 5 5 5    Reflexes are ***2+ and symmetric at the biceps, brachioradialis, patella and achilles.   Hoffman's is absent.  Clonus is not present.   Bilateral upper and lower extremity sensation is intact to light touch.     Gait is normal.   ***No difficulty with tandem gait.    Medical Decision Making  Imaging: Lumbar xrays dated 05/17/24:  FINDINGS: Normal alignment. No  fracture. Mild degenerative facet disease in the lower lumbar spine. Disc spaces maintained.   IMPRESSION: No acute bony abnormality.     Electronically Signed   By: Franky Crease M.D.   On: 05/17/2024 20:18   Thoracic xrays dated 05/17/24:  FINDINGS: Normal alignment. No fracture. Anterior degenerative spurring in the mid to lower thoracic spine.   IMPRESSION: No acute bony abnormality.     Electronically Signed   By: Franky Crease M.D.   On: 05/17/2024 20:19  I have personally reviewed the images and agree with the above interpretation.  Assessment and Plan: Ms. Stogsdill is a pleasant 58 y.o. female has ***  Treatment options discussed with patient and following plan made:   - Order for physical therapy for *** spine ***. Patient to call to schedule appointment. *** - Continue current medications including ***. Reviewed dosing and side effects.  - Prescription for ***. Reviewed dosing and side effects. Take with food.  - Prescription for *** to take prn muscle spasms. Reviewed dosing and side effects. Discussed this can cause drowsiness.  - MRI of *** to further evaluate *** radiculopathy. No improvement time or medications (***).  - Referral to PMR at [facility] to discuss  possible *** injections.  - Will schedule phone visit to review MRI results once I get them back.   I spent a total of *** minutes in face-to-face and non-face-to-face activities related to this patient's care today including review of outside records, review of imaging, review of symptoms, physical exam, discussion of differential diagnosis, discussion of treatment options, and documentation.   Thank you for involving me in the care of this patient.   Glade Boys PA-C Dept. of Neurosurgery

## 2024-05-30 ENCOUNTER — Ambulatory Visit: Admitting: Orthopedic Surgery

## 2024-07-22 ENCOUNTER — Emergency Department (HOSPITAL_COMMUNITY)

## 2024-07-22 ENCOUNTER — Emergency Department (HOSPITAL_COMMUNITY)
Admission: EM | Admit: 2024-07-22 | Discharge: 2024-07-23 | Disposition: A | Discharge status: Home / Self Care | Attending: Emergency Medicine | Admitting: Emergency Medicine

## 2024-07-22 ENCOUNTER — Encounter (HOSPITAL_COMMUNITY): Payer: Self-pay

## 2024-07-22 DIAGNOSIS — E039 Hypothyroidism, unspecified: Secondary | ICD-10-CM | POA: Diagnosis not present

## 2024-07-22 DIAGNOSIS — R1031 Right lower quadrant pain: Secondary | ICD-10-CM | POA: Diagnosis not present

## 2024-07-22 DIAGNOSIS — R4182 Altered mental status, unspecified: Secondary | ICD-10-CM

## 2024-07-22 DIAGNOSIS — R7309 Other abnormal glucose: Secondary | ICD-10-CM | POA: Diagnosis not present

## 2024-07-22 DIAGNOSIS — D72829 Elevated white blood cell count, unspecified: Secondary | ICD-10-CM | POA: Diagnosis not present

## 2024-07-22 DIAGNOSIS — E876 Hypokalemia: Secondary | ICD-10-CM | POA: Diagnosis not present

## 2024-07-22 LAB — CBC WITH DIFFERENTIAL/PLATELET
Abs Immature Granulocytes: 0.11 K/uL — ABNORMAL HIGH (ref 0.00–0.07)
Basophils Absolute: 0.1 K/uL (ref 0.0–0.1)
Basophils Relative: 0 %
Eosinophils Absolute: 0.2 K/uL (ref 0.0–0.5)
Eosinophils Relative: 1 %
HCT: 49 % — ABNORMAL HIGH (ref 36.0–46.0)
Hemoglobin: 16.8 g/dL — ABNORMAL HIGH (ref 12.0–15.0)
Immature Granulocytes: 1 %
Lymphocytes Relative: 16 %
Lymphs Abs: 2.8 K/uL (ref 0.7–4.0)
MCH: 32.2 pg (ref 26.0–34.0)
MCHC: 34.3 g/dL (ref 30.0–36.0)
MCV: 93.9 fL (ref 80.0–100.0)
Monocytes Absolute: 0.8 K/uL (ref 0.1–1.0)
Monocytes Relative: 4 %
Neutro Abs: 13.2 K/uL — ABNORMAL HIGH (ref 1.7–7.7)
Neutrophils Relative %: 78 %
Platelets: 235 K/uL (ref 150–400)
RBC: 5.22 MIL/uL — ABNORMAL HIGH (ref 3.87–5.11)
RDW: 12.1 % (ref 11.5–15.5)
WBC: 17.2 K/uL — ABNORMAL HIGH (ref 4.0–10.5)
nRBC: 0 % (ref 0.0–0.2)

## 2024-07-22 LAB — I-STAT VENOUS BLOOD GAS, ED
Acid-Base Excess: 9 mmol/L — ABNORMAL HIGH (ref 0.0–2.0)
Bicarbonate: 35.3 mmol/L — ABNORMAL HIGH (ref 20.0–28.0)
Calcium, Ion: 1.1 mmol/L — ABNORMAL LOW (ref 1.15–1.40)
HCT: 50 % — ABNORMAL HIGH (ref 36.0–46.0)
Hemoglobin: 17 g/dL — ABNORMAL HIGH (ref 12.0–15.0)
O2 Saturation: 36 %
Potassium: 3 mmol/L — ABNORMAL LOW (ref 3.5–5.1)
Sodium: 140 mmol/L (ref 135–145)
TCO2: 37 mmol/L — ABNORMAL HIGH (ref 22–32)
pCO2, Ven: 53.9 mmHg (ref 44–60)
pH, Ven: 7.424 (ref 7.25–7.43)
pO2, Ven: 21 mmHg — CL (ref 32–45)

## 2024-07-22 LAB — AMMONIA: Ammonia: 31 umol/L (ref 9–35)

## 2024-07-22 LAB — I-STAT CG4 LACTIC ACID, ED: Lactic Acid, Venous: 1.3 mmol/L (ref 0.5–1.9)

## 2024-07-22 LAB — CBG MONITORING, ED: Glucose-Capillary: 104 mg/dL — ABNORMAL HIGH (ref 70–99)

## 2024-07-22 LAB — ETHANOL: Alcohol, Ethyl (B): <15 mg/dL (ref <15)

## 2024-07-22 LAB — TSH: TSH: 1.847 u[IU]/mL (ref 0.350–4.500)

## 2024-07-22 MED ORDER — SODIUM CHLORIDE 0.9 % IV BOLUS
1000.0000 mL | Freq: Once | INTRAVENOUS | Status: AC
  Administered 2024-07-22: 23:00:00 1000 mL via INTRAVENOUS

## 2024-07-22 NOTE — ED Notes (Signed)
 Patient called out c/o sudden, severe mid abd pain. Reports extensive abd surgeries in the past. Jerrol, MD notified.

## 2024-07-22 NOTE — ED Triage Notes (Signed)
 BIB [place] [facility] from home, states son called due to finding patient rolling around on living room floor altered approx. 1 hour pta. Per EMS husband called pt around 1830 and states pt was acting confused at that time as well. Per EMS LKN 1700. Per EMS en route pt was uncooperative and they administed 5mg  Midazolam  pta.   EMS VS  76 HR 130/90 BP 97 BGL

## 2024-07-22 NOTE — ED Notes (Signed)
 Patient transported to CT

## 2024-07-22 NOTE — ED Notes (Signed)
 BGL 104 at this time.

## 2024-07-22 NOTE — ED Provider Notes (Signed)
 [place] EMERGENCY DEPARTMENT AT [facility] Provider Note   CSN: [id number] Arrival date & time: 07/22/24  2006     Patient presents with: Altered Mental Status   [name] is a 58 y.o. female.    Altered Mental Status    58 year old female presenting to the emergency department with alteration in mental status.  Per the patient's son the patient woke up from a nap around 4 PM and was acutely confused crying out.  She was found rolling around in the living room floor.  Per EMS the patient was notably uncooperative and so that the patient was administered 5 mg of Versed  prior to arrival.  No focal deficits reported by EMS.  Only sedating medications per son the patient is on is Xanax twice daily.  He denies any heavy alcohol use, denies any history of alcohol withdrawal or Xanax/benzodiazepine withdrawal.  She arrives GCS 14, ABC intact, AAO x 3.  Prior to Admission medications  Medication Sig Start Date End Date Taking? Authorizing Provider  cetirizine  (ZYRTEC  ALLERGY) 10 MG tablet Take 1 tablet (10 mg total) by mouth 2 (two) times daily. 11/01/23   Palumbo, April, MD  cyanocobalamin (,VITAMIN B-12,) 1000 MCG/ML injection Does once a week as of 05-18-2020 08/17/19   [provider]  famotidine  (PEPCID ) 20 MG tablet Take 1 tablet (20 mg total) by mouth 2 (two) times daily. 11/01/23   Palumbo, April, MD  hyoscyamine (LEVSIN SL) 0.125 MG SL tablet Place under the tongue every 4 (four) hours as needed.  06/29/19   [provider]  metoCLOPramide  (REGLAN ) 10 MG tablet Take 10 mg by mouth every 6 (six) hours as needed.  03/19/19   [provider]  omeprazole (PRILOSEC) 40 MG capsule Take one capsule twice a day for control of stomach acid problems 09/19/21   [provider]  promethazine  (PHENERGAN ) 25 MG tablet Take 1 tablet (25 mg total) by mouth every 6 (six) hours as needed for nausea or vomiting. 07/13/19   Patt Alm Macho, MD  QUEtiapine  (SEROQUEL) 50 MG tablet TAKE ONE TABLET AT BEDTIME FOR INSOMNIA 09/19/21   [provider]  sucralfate  (CARAFATE ) 1 g tablet Take 1 tablet (1 g total) by mouth 4 (four) times daily -  with meals and at bedtime. 07/13/19   Patt Alm Macho, MD    Allergies: Amoxicillin-pot clavulanate, Codeine, Duloxetine, Nsaids, Sumatriptan succinate, and Corticosteroids    Review of Systems  Unable to perform ROS: Mental status change    Updated Vital Signs BP (!) 141/83   Pulse 75   Temp 98.1 F (36.7 C) (Rectal)   Resp 13   Ht 5' 4 (1.626 m)   SpO2 100%   BMI 24.37 kg/m   Physical Exam Vitals and nursing note reviewed.  Constitutional:      General: She is not in acute distress.    Appearance: She is well-developed.     Comments: GCS 14, Somnolent but arousable to verbal stim, AAOX3  HENT:     Head: Normocephalic and atraumatic.  Eyes:     Conjunctiva/sclera: Conjunctivae normal.  Cardiovascular:     Rate and Rhythm: Normal rate and regular rhythm.     Heart sounds: No murmur heard. Pulmonary:     Effort: Pulmonary effort is normal. No respiratory distress.     Breath sounds: Normal breath sounds.  Abdominal:     Palpations: Abdomen is soft.     Tenderness: There is no abdominal tenderness.  Musculoskeletal:        General: No swelling.     Cervical back: Neck supple.  Skin:    General: Skin is warm and dry.     Capillary Refill: Capillary refill takes less than 2 seconds.  Neurological:     Mental Status: She is alert.     Comments: MENTAL STATUS EXAM:    Orientation: Alert and oriented to person, place and time.  Memory: Cooperative, follows commands well.  Language: Speech is mildly slurred  CRANIAL NERVES:    CN 2 (Optic): Visual fields intact to confrontation.  CN 3,4,6 (EOM): Pupils equal and reactive to light. Full extraocular eye movement without nystagmus.  CN 5 (Trigeminal): Facial sensation is normal, no weakness of masticatory muscles.  CN 7 (Facial):  No facial weakness or asymmetry.  CN 8 (Auditory): Auditory acuity grossly normal.  CN 9,10 (Glossophar): The uvula is midline, the palate elevates symmetrically.  CN 11 (spinal access): Normal sternocleidomastoid and trapezius strength.  CN 12 (Hypoglossal): The tongue is midline. No atrophy or fasciculations.SABRA   MOTOR:  Muscle Strength: 5/5RUE, 5/5LUE, 5/5RLE, 5/5LLE.   COORDINATION:   No tremor.   SENSATION:   Intact to light touch all four extremities.  GAIT: Gait not assessed   Psychiatric:        Mood and Affect: Mood normal.     (all labs ordered are listed, but only abnormal results are displayed) Labs Reviewed  CBC WITH DIFFERENTIAL/PLATELET - Abnormal; Notable for the following components:      Result Value   WBC 17.2 (*)    RBC 5.22 (*)    Hemoglobin 16.8 (*)    HCT 49.0 (*)    Neutro Abs 13.2 (*)    Abs Immature Granulocytes 0.11 (*)    All other components within normal limits  CBG MONITORING, ED - Abnormal; Notable for the following components:   Glucose-Capillary 104 (*)    All other components within normal limits  I-STAT VENOUS BLOOD GAS, ED - Abnormal; Notable for the following components:   pO2, Ven 21 (*)    Bicarbonate 35.3 (*)    TCO2 37 (*)    Acid-Base Excess 9.0 (*)    Potassium 3.0 (*)    Calcium, Ion 1.10 (*)    HCT 50.0 (*)    Hemoglobin 17.0 (*)    All other components within normal limits  CULTURE, BLOOD (ROUTINE X 2)  CULTURE, BLOOD (ROUTINE X 2)  COMPREHENSIVE METABOLIC PANEL WITH GFR  URINALYSIS, ROUTINE W REFLEX MICROSCOPIC  AMMONIA  RAPID URINE DRUG SCREEN, HOSP PERFORMED  ETHANOL  TSH  T4, FREE  I-STAT CG4 LACTIC ACID, ED    EKG: EKG Interpretation Date/Time:  Monday July 22 2024 20:30:39 EST Ventricular Rate:  78 PR Interval:  181 QRS Duration:  93 QT Interval:  395 QTC Calculation: 450 R Axis:   40  Text Interpretation: Sinus rhythm Confirmed by Jerrol Agent ([id number]) on 07/22/2024 8:40:40 PM  Radiology: CT  HEAD WO CONTRAST Result Date: 07/22/2024 EXAM: CT HEAD WITHOUT 07/22/2024 09:02:00 PM TECHNIQUE: CT of the head was performed without the administration of intravenous contrast. Automated exposure control, iterative reconstruction, and/or weight based adjustment of the mA/kV was utilized to reduce the radiation dose to as low as reasonably achievable. COMPARISON: None available. CLINICAL HISTORY: Mental status change, unknown cause FINDINGS: BRAIN AND VENTRICLES: No acute intracranial hemorrhage. No mass effect or midline shift. No extra-axial fluid collection. No evidence of acute infarct. ORBITS: No acute abnormality. SINUSES  AND MASTOIDS: No acute abnormality. SOFT TISSUES AND SKULL: No acute skull fracture. No acute soft tissue abnormality. IMPRESSION: 1. No acute intracranial abnormality. Electronically signed by: Oneil Devonshire MD 07/22/2024 09:11 PM EST RP Workstation: [id number]   DG Chest Port 1 View Result Date: 07/22/2024 EXAM: 1 VIEW(S) XRAY OF THE CHEST 07/22/2024 08:54:00 PM COMPARISON: 11/13/2023 CLINICAL HISTORY: Altered mental status FINDINGS: LUNGS AND PLEURA: No focal pulmonary opacity. No pleural effusion. No pneumothorax. HEART AND MEDIASTINUM: No acute abnormality of the cardiac and mediastinal silhouettes. BONES AND SOFT TISSUES: No acute osseous abnormality. IMPRESSION: 1. No acute cardiopulmonary process. Electronically signed by: Oneil Devonshire MD 07/22/2024 08:58 PM EST RP Workstation: [id number]     Procedures   Medications Ordered in the ED  sodium chloride  0.9 % bolus 1,000 mL (has no administration in time range)    Clinical Course as of 07/22/24 2243  Mon Jul 22, 2024  2242 WBC(!): 17.2 [JL]  2242 Hemoglobin(!): 16.8 [JL]    Clinical Course User Index [JL] Jerrol Agent, MD                                 Medical Decision Making Amount and/or Complexity of Data Reviewed Labs: ordered. Decision-making details documented in ED Course. Radiology: ordered.     58 year old female presenting to the emergency department with alteration in mental status.  Per the patient's son the patient woke up from a nap around 4 PM and was acutely confused crying out.  She was found rolling around in the living room floor.  Per EMS the patient was notably uncooperative and so that the patient was administered 5 mg of Versed  prior to arrival.  No focal deficits reported by EMS.  Only sedating medications per son the patient is on is Xanax twice daily.  He denies any heavy alcohol use, denies any history of alcohol withdrawal or Xanax/benzodiazepine withdrawal.  She arrives GCS 14, ABC intact, AAO x 3.    Medical Decision Making:   CARLETA WOODROW is a 58 y.o. female who presented to the ED today with altered mental status detailed above.     Complete initial physical exam performed, notably the patient  was GCS 14, somnolent, AO x 3, mildly slurred speech but otherwise intact neurologic exam.    Reviewed and confirmed nursing documentation for past medical history, family history, social history.    Initial Assessment:   With the patient's presentation of altered mental status, most likely diagnosis is delerium 2/2 infectious etiology (UTI/CAP/URI) vs metabolic abnormality (Na/K/Mg/Ca) vs nonspecific etiology.  Additionally consider polypharmacy in the setting of the patient's Xanax use, benzodiazepine withdrawal.  Other diagnoses were considered including (but not limited to) CVA, ICH, intracranial mass, critical dehydration, heptatic dysfunction, uremia, hypercarbia, intoxication, endrocrine abnormality, toxidrome. These are considered less likely due to history of present illness and physical exam findings.   This is most consistent with an acute life/limb threatening illness complicated by underlying chronic conditions.  Initial Plan:  CTH to evaluate for intracranial etiology of patient's symptoms  Screening labs including CBC and Metabolic panel to evaluate for infectious  or metabolic etiology of disease.  Urinalysis with reflex culture ordered to evaluate for UTI or relevant urologic/nephrologic pathology.  CXR to evaluate for structural/infectious intrathoracic pathology.  TSH for evaluation for endrocrine etiology Drug screen for toxidrome evaluation VBG for acid/base status and further toxidrome evaulation EKG to evaluate for cardiac pathology Objective evaluation as  below reviewed    Following her initial presentation to the emergency department, the patient subsequent complained of severe abdominal pain.  My evaluation, the patient was resting comfortably in bed, still slightly altered.  She has right lower quadrant tenderness on exam that appears to be moderate in severity, does not appear to be causing her distress without active palpation. Will add on CT Abd Pelvis to further evaluate  Initial Study Results:   Laboratory  All laboratory results reviewed without evidence of clinically relevant pathology.   Exceptions include: Leukocytosis noted to 17.2, evidence of hemoconcentration with a hemoglobin of 16.8, CBG 104, initial lactic acid 1.3, VBG without an acidosis or alkalosis, remaining laboratory evaluation pending.  EKG EKG was reviewed independently. Rate, rhythm, axis, intervals all examined and without medically relevant abnormality. ST segments without concerns for elevations.    Radiology:  All images reviewed independently. Agree with radiology report at this time.   CT HEAD WO CONTRAST Result Date: 07/22/2024 EXAM: CT HEAD WITHOUT 07/22/2024 09:02:00 PM TECHNIQUE: CT of the head was performed without the administration of intravenous contrast. Automated exposure control, iterative reconstruction, and/or weight based adjustment of the mA/kV was utilized to reduce the radiation dose to as low as reasonably achievable. COMPARISON: None available. CLINICAL HISTORY: Mental status change, unknown cause FINDINGS: BRAIN AND VENTRICLES: No acute  intracranial hemorrhage. No mass effect or midline shift. No extra-axial fluid collection. No evidence of acute infarct. ORBITS: No acute abnormality. SINUSES AND MASTOIDS: No acute abnormality. SOFT TISSUES AND SKULL: No acute skull fracture. No acute soft tissue abnormality. IMPRESSION: 1. No acute intracranial abnormality. Electronically signed by: Oneil Devonshire MD 07/22/2024 09:11 PM EST RP Workstation: [id number]   DG Chest Port 1 View Result Date: 07/22/2024 EXAM: 1 VIEW(S) XRAY OF THE CHEST 07/22/2024 08:54:00 PM COMPARISON: 11/13/2023 CLINICAL HISTORY: Altered mental status FINDINGS: LUNGS AND PLEURA: No focal pulmonary opacity. No pleural effusion. No pneumothorax. HEART AND MEDIASTINUM: No acute abnormality of the cardiac and mediastinal silhouettes. BONES AND SOFT TISSUES: No acute osseous abnormality. IMPRESSION: 1. No acute cardiopulmonary process. Electronically signed by: Oneil Devonshire MD 07/22/2024 08:58 PM EST RP Workstation: [id number]    Full laboratory evaluation and CT imaging pending at time of signout.  Given the patient's alteration in mental status, plan for likely admission.  Signout given to Dr. Rogelia at 2300 to follow-up results of labs and CT imaging and reassess the patient, if patient remains altered, plan for likely admission.     Final diagnoses:  None    ED Discharge Orders     None          Jerrol Agent, MD 07/22/24 2244

## 2024-07-22 NOTE — ED Provider Notes (Signed)
°  [facility] AT [facility] Provider Assume Care Note I assumed care of [name] on 07/22/2024 at 11 PM from Dr. Jerrol.   Briefly, [name] is a 58 y.o. female who: PMHx: Elevated BMI, hypothyroidism, depression, anxiety, GERD P/w altered mental status, awoke from nap altered, LNK 4 PM, no focal neurologic deficits on exam Patient somnolent but arousable, not back to baseline Has leukocytosis and developed right lower quadrant pain while in the ED  Clinical Course as of 07/22/24 2317  Mon Jul 22, 2024  2242 WBC(!): 17.2 [JL]  2242 Hemoglobin(!): 16.8 [JL]  2257 On xanax and flexeril , acute onset confusion this afternoon, LKN 4pm, no focal decificts outside code stroke widnow, polypharmacy, somnolent but arousable, admit if doest get back to baseline, developed RLQ pain while in ED  [LS]    Clinical Course User Index [JL] Jerrol Agent, MD [LS] Rogelia Jerilynn RAMAN, MD    Plan at the time of handoff: Follow-up thyroid  studies, CMP, CT of the abdomen pelvis, reassess mental status   Please refer to the original providers note for additional information regarding the care of [name].  Reassessment: I personally reassessed the patient: ***Patient without any acute complaints or additional questions. ***Patient complained of ***.   Vital Signs:  ED Triage Vitals  Encounter Vitals Group     BP 07/22/24 2024 (!) 158/68     Girls Systolic BP Percentile --      Girls Diastolic BP Percentile --      Boys Systolic BP Percentile --      Boys Diastolic BP Percentile --      Pulse Rate 07/22/24 2024 64     Resp 07/22/24 2024 18     Temp 07/22/24 2024 98.1 F (36.7 C)     Temp Source 07/22/24 2024 Rectal     SpO2 07/22/24 2024 99 %     Weight --      Height 07/22/24 2020 5' 4 (1.626 m)     Head Circumference --      Peak Flow --      Pain Score 07/22/24 2020 7     Pain Loc --      Pain Education --      Exclude from Growth Chart  --      Hemodynamics:  The patient is ***hemodynamically stable. Mental Status:  The patient is ***alert  Additional MDM: TSH WNL.  Free T4 *** CMP ***.  UA ***.  UDS ***.  CT of the abdomen and pelvis ***.  Disposition: {ODIPDEN:66611}   FREDRIK CANDIE Rogelia, MD Emergency Medicine

## 2024-07-23 ENCOUNTER — Emergency Department (HOSPITAL_COMMUNITY)

## 2024-07-23 LAB — URINALYSIS, ROUTINE W REFLEX MICROSCOPIC
Bilirubin Urine: NEGATIVE
Glucose, UA: NEGATIVE mg/dL
Hgb urine dipstick: NEGATIVE
Ketones, ur: 5 mg/dL — AB
Nitrite: NEGATIVE
Protein, ur: NEGATIVE mg/dL
Specific Gravity, Urine: 1.015 (ref 1.005–1.030)
pH: 5 (ref 5.0–8.0)

## 2024-07-23 LAB — MAGNESIUM: Magnesium: 2.1 mg/dL (ref 1.7–2.4)

## 2024-07-23 LAB — T4, FREE: Free T4: 1.11 ng/dL (ref 0.61–1.12)

## 2024-07-23 LAB — COMPREHENSIVE METABOLIC PANEL WITH GFR
ALT: 17 U/L (ref 0–44)
AST: 25 U/L (ref 15–41)
Albumin: 4 g/dL (ref 3.5–5.0)
Alkaline Phosphatase: 86 U/L (ref 38–126)
Anion gap: 13 (ref 5–15)
BUN: 7 mg/dL (ref 6–20)
CO2: 32 mmol/L (ref 22–32)
Calcium: 9.4 mg/dL (ref 8.9–10.3)
Chloride: 94 mmol/L — ABNORMAL LOW (ref 98–111)
Creatinine, Ser: 0.68 mg/dL (ref 0.44–1.00)
GFR, Estimated: >60 mL/min (ref >60)
Glucose, Bld: 103 mg/dL — ABNORMAL HIGH (ref 70–99)
Potassium: 2.9 mmol/L — ABNORMAL LOW (ref 3.5–5.1)
Sodium: 139 mmol/L (ref 135–145)
Total Bilirubin: 0.7 mg/dL (ref 0.0–1.2)
Total Protein: 7.7 g/dL (ref 6.5–8.1)

## 2024-07-23 LAB — RAPID URINE DRUG SCREEN, HOSP PERFORMED
Amphetamines: NOT DETECTED
Barbiturates: NOT DETECTED
Benzodiazepines: POSITIVE — AB
Cocaine: NOT DETECTED
Opiates: POSITIVE — AB
Tetrahydrocannabinol: NOT DETECTED

## 2024-07-23 MED ORDER — IOHEXOL 350 MG/ML SOLN
75.0000 mL | Freq: Once | INTRAVENOUS | Status: AC | PRN
  Administered 2024-07-23: 01:00:00 75 mL via INTRAVENOUS

## 2024-07-23 MED ORDER — POTASSIUM CHLORIDE CRYS ER 10 MEQ PO TBCR: 20.0000 meq | EXTENDED_RELEASE_TABLET | Freq: Every day | ORAL | 0 refills | Status: AC

## 2024-07-23 MED ORDER — KETOROLAC TROMETHAMINE 15 MG/ML IJ SOLN
15.0000 mg | Freq: Once | INTRAMUSCULAR | Status: AC
  Administered 2024-07-23: 15 mg via INTRAVENOUS
  Filled 2024-07-23: qty 1

## 2024-07-23 MED ORDER — ACETAMINOPHEN 500 MG PO TABS
1000.0000 mg | ORAL_TABLET | Freq: Once | ORAL | Status: AC
  Administered 2024-07-23: 1000 mg via ORAL
  Filled 2024-07-23: qty 2

## 2024-07-23 MED ORDER — ONDANSETRON HCL 4 MG/2ML IJ SOLN
4.0000 mg | Freq: Once | INTRAMUSCULAR | Status: AC
  Administered 2024-07-23: 02:00:00 4 mg via INTRAVENOUS
  Filled 2024-07-23: qty 2

## 2024-07-23 MED ORDER — POTASSIUM CHLORIDE 20 MEQ PO PACK
40.0000 meq | PACK | Freq: Two times a day (BID) | ORAL | Status: DC
  Administered 2024-07-23: 02:00:00 40 meq via ORAL
  Filled 2024-07-23: qty 2

## 2024-07-23 NOTE — Discharge Instructions (Signed)
 [name]  Thank you for allowing us  to take care of you today.  You came to the Emergency Department today because you were confused and weak.  While you are here you also developed pain in the right lower part of your belly.  Here in the emergency department your labs were reassuring, you had a bit of a white blood cell count elevation which is nonspecific, as well as some low potassium.  We gave you some potassium here in the emergency department, and we are prescribing you 3 days of potassium to take at home.  Please call the number on this paperwork and they will help you find a new PCP.  You should have your PCP recheck your potassium to see if you need further oral replacement potassium.  It is unclear what exactly caused your symptoms such as your confusion and your belly pain.  Your CT was reassuring here in the emergency department, and your confusion improved.  It is possible that your medicines taken together because some of the confusion, as we age, or body digest's and metabolizes medicines more slowly, so medications like Flexeril  and Xanax, particularly if taken at similar timeframes, can cause symptoms such as confusion or sleepiness.  To-Do: 1. Please follow-up with your primary doctor within 1 month / as soon as possible.   Please return to the Emergency Department or call 911 if you experience have worsening of your symptoms, or do not get better, chest pain, shortness of breath, severe or significantly worsening pain, high fever, severe confusion, pass out or have any reason to think that you need emergency medical care.   We hope you feel better soon.   Department of Emergency Medicine [facility] [place]

## 2024-07-27 ENCOUNTER — Telehealth: Payer: Self-pay

## 2024-07-27 LAB — CULTURE, BLOOD (ROUTINE X 2): Culture: NO GROWTH

## 2024-07-27 NOTE — Telephone Encounter (Signed)
 Patient called in to see if she can get a antibiotic. The patient was seen on December 15. Instructed her to seek medical care idf she feels she has worsened or the pain is still there. She states she will go to urgent care near her home.
